# Patient Record
Sex: Male | Born: 1950 | Race: White | Hispanic: No | Marital: Married | Smoking: Former smoker
Health system: Southern US, Community
[De-identification: ages and names within clinical notes are randomized; demographics above are authoritative.]

## PROBLEM LIST (undated history)

## (undated) DIAGNOSIS — F419 Anxiety disorder, unspecified: Secondary | ICD-10-CM

## (undated) DIAGNOSIS — R112 Nausea with vomiting, unspecified: Secondary | ICD-10-CM

## (undated) DIAGNOSIS — Z8489 Family history of other specified conditions: Secondary | ICD-10-CM

## (undated) DIAGNOSIS — Z9889 Other specified postprocedural states: Secondary | ICD-10-CM

## (undated) DIAGNOSIS — J189 Pneumonia, unspecified organism: Secondary | ICD-10-CM

## (undated) DIAGNOSIS — K5790 Diverticulosis of intestine, part unspecified, without perforation or abscess without bleeding: Secondary | ICD-10-CM

## (undated) DIAGNOSIS — T7840XA Allergy, unspecified, initial encounter: Secondary | ICD-10-CM

## (undated) DIAGNOSIS — C801 Malignant (primary) neoplasm, unspecified: Secondary | ICD-10-CM

## (undated) DIAGNOSIS — K648 Other hemorrhoids: Secondary | ICD-10-CM

## (undated) DIAGNOSIS — M1990 Unspecified osteoarthritis, unspecified site: Secondary | ICD-10-CM

## (undated) HISTORY — DX: Nausea with vomiting, unspecified: R11.2

## (undated) HISTORY — DX: Allergy, unspecified, initial encounter: T78.40XA

## (undated) HISTORY — DX: Malignant (primary) neoplasm, unspecified: C80.1

## (undated) HISTORY — DX: Diverticulosis of intestine, part unspecified, without perforation or abscess without bleeding: K57.90

## (undated) HISTORY — DX: Unspecified osteoarthritis, unspecified site: M19.90

## (undated) HISTORY — PX: APPENDECTOMY: SHX54

## (undated) HISTORY — PX: LASIK: SHX215

## (undated) HISTORY — PX: SKIN CANCER EXCISION: SHX779

## (undated) HISTORY — PX: CARPAL TUNNEL RELEASE: SHX101

## (undated) HISTORY — DX: Other hemorrhoids: K64.8

## (undated) HISTORY — DX: Other specified postprocedural states: Z98.890

## (undated) HISTORY — PX: COLONOSCOPY: SHX174

## (undated) HISTORY — PX: ROTATOR CUFF REPAIR: SHX139

## (undated) HISTORY — PX: INGUINAL HERNIA REPAIR: SUR1180

## (undated) HISTORY — PX: OTHER SURGICAL HISTORY: SHX169

---

## 1998-04-06 ENCOUNTER — Ambulatory Visit (HOSPITAL_BASED_OUTPATIENT_CLINIC_OR_DEPARTMENT_OTHER): Admission: RE | Admit: 1998-04-06 | Discharge: 1998-04-06 | Payer: Self-pay | Admitting: Orthopedic Surgery

## 2001-02-05 ENCOUNTER — Ambulatory Visit (HOSPITAL_COMMUNITY): Admission: RE | Admit: 2001-02-05 | Discharge: 2001-02-05 | Payer: Self-pay | Admitting: Gastroenterology

## 2005-10-02 ENCOUNTER — Ambulatory Visit (HOSPITAL_BASED_OUTPATIENT_CLINIC_OR_DEPARTMENT_OTHER): Admission: RE | Admit: 2005-10-02 | Discharge: 2005-10-02 | Payer: Self-pay | Admitting: Orthopaedic Surgery

## 2016-01-24 DIAGNOSIS — Z13220 Encounter for screening for lipoid disorders: Secondary | ICD-10-CM | POA: Diagnosis not present

## 2016-01-24 DIAGNOSIS — Z1389 Encounter for screening for other disorder: Secondary | ICD-10-CM | POA: Diagnosis not present

## 2016-01-24 DIAGNOSIS — Z1329 Encounter for screening for other suspected endocrine disorder: Secondary | ICD-10-CM | POA: Diagnosis not present

## 2016-01-24 DIAGNOSIS — Z0000 Encounter for general adult medical examination without abnormal findings: Secondary | ICD-10-CM | POA: Diagnosis not present

## 2016-01-24 DIAGNOSIS — Z125 Encounter for screening for malignant neoplasm of prostate: Secondary | ICD-10-CM | POA: Diagnosis not present

## 2016-01-24 DIAGNOSIS — Z1211 Encounter for screening for malignant neoplasm of colon: Secondary | ICD-10-CM | POA: Diagnosis not present

## 2016-01-30 DIAGNOSIS — C44622 Squamous cell carcinoma of skin of right upper limb, including shoulder: Secondary | ICD-10-CM | POA: Diagnosis not present

## 2016-03-16 DIAGNOSIS — H2513 Age-related nuclear cataract, bilateral: Secondary | ICD-10-CM | POA: Diagnosis not present

## 2016-03-30 DIAGNOSIS — H6123 Impacted cerumen, bilateral: Secondary | ICD-10-CM | POA: Diagnosis not present

## 2016-03-30 DIAGNOSIS — J188 Other pneumonia, unspecified organism: Secondary | ICD-10-CM | POA: Diagnosis not present

## 2016-08-24 DIAGNOSIS — J0100 Acute maxillary sinusitis, unspecified: Secondary | ICD-10-CM | POA: Diagnosis not present

## 2016-08-24 DIAGNOSIS — J209 Acute bronchitis, unspecified: Secondary | ICD-10-CM | POA: Diagnosis not present

## 2016-10-02 DIAGNOSIS — L570 Actinic keratosis: Secondary | ICD-10-CM | POA: Diagnosis not present

## 2016-10-02 DIAGNOSIS — R233 Spontaneous ecchymoses: Secondary | ICD-10-CM | POA: Diagnosis not present

## 2016-10-02 DIAGNOSIS — D485 Neoplasm of uncertain behavior of skin: Secondary | ICD-10-CM | POA: Diagnosis not present

## 2017-01-31 DIAGNOSIS — Z1211 Encounter for screening for malignant neoplasm of colon: Secondary | ICD-10-CM | POA: Diagnosis not present

## 2017-01-31 DIAGNOSIS — Z0000 Encounter for general adult medical examination without abnormal findings: Secondary | ICD-10-CM | POA: Diagnosis not present

## 2017-01-31 DIAGNOSIS — F3160 Bipolar disorder, current episode mixed, unspecified: Secondary | ICD-10-CM | POA: Diagnosis not present

## 2017-01-31 DIAGNOSIS — M79609 Pain in unspecified limb: Secondary | ICD-10-CM | POA: Diagnosis not present

## 2017-01-31 DIAGNOSIS — Z1379 Encounter for other screening for genetic and chromosomal anomalies: Secondary | ICD-10-CM | POA: Diagnosis not present

## 2017-01-31 DIAGNOSIS — Z13220 Encounter for screening for lipoid disorders: Secondary | ICD-10-CM | POA: Diagnosis not present

## 2017-01-31 DIAGNOSIS — Z1329 Encounter for screening for other suspected endocrine disorder: Secondary | ICD-10-CM | POA: Diagnosis not present

## 2017-01-31 DIAGNOSIS — I10 Essential (primary) hypertension: Secondary | ICD-10-CM | POA: Diagnosis not present

## 2017-01-31 DIAGNOSIS — Z125 Encounter for screening for malignant neoplasm of prostate: Secondary | ICD-10-CM | POA: Diagnosis not present

## 2017-04-11 DIAGNOSIS — L570 Actinic keratosis: Secondary | ICD-10-CM | POA: Diagnosis not present

## 2017-07-05 DIAGNOSIS — H5213 Myopia, bilateral: Secondary | ICD-10-CM | POA: Diagnosis not present

## 2017-10-03 DIAGNOSIS — L300 Nummular dermatitis: Secondary | ICD-10-CM | POA: Diagnosis not present

## 2017-10-03 DIAGNOSIS — L821 Other seborrheic keratosis: Secondary | ICD-10-CM | POA: Diagnosis not present

## 2017-10-03 DIAGNOSIS — D1801 Hemangioma of skin and subcutaneous tissue: Secondary | ICD-10-CM | POA: Diagnosis not present

## 2017-10-03 DIAGNOSIS — C44619 Basal cell carcinoma of skin of left upper limb, including shoulder: Secondary | ICD-10-CM | POA: Diagnosis not present

## 2018-01-16 DIAGNOSIS — J069 Acute upper respiratory infection, unspecified: Secondary | ICD-10-CM | POA: Diagnosis not present

## 2018-02-03 DIAGNOSIS — Z0000 Encounter for general adult medical examination without abnormal findings: Secondary | ICD-10-CM | POA: Diagnosis not present

## 2018-02-03 DIAGNOSIS — Z139 Encounter for screening, unspecified: Secondary | ICD-10-CM | POA: Diagnosis not present

## 2018-02-03 DIAGNOSIS — Z1331 Encounter for screening for depression: Secondary | ICD-10-CM | POA: Diagnosis not present

## 2018-02-03 DIAGNOSIS — Z131 Encounter for screening for diabetes mellitus: Secondary | ICD-10-CM | POA: Diagnosis not present

## 2018-02-03 DIAGNOSIS — Z13220 Encounter for screening for lipoid disorders: Secondary | ICD-10-CM | POA: Diagnosis not present

## 2018-02-03 DIAGNOSIS — Z1211 Encounter for screening for malignant neoplasm of colon: Secondary | ICD-10-CM | POA: Diagnosis not present

## 2018-02-03 DIAGNOSIS — Z125 Encounter for screening for malignant neoplasm of prostate: Secondary | ICD-10-CM | POA: Diagnosis not present

## 2018-04-12 DIAGNOSIS — S61203A Unspecified open wound of left middle finger without damage to nail, initial encounter: Secondary | ICD-10-CM | POA: Diagnosis not present

## 2018-09-29 DIAGNOSIS — D485 Neoplasm of uncertain behavior of skin: Secondary | ICD-10-CM | POA: Diagnosis not present

## 2018-09-29 DIAGNOSIS — C44519 Basal cell carcinoma of skin of other part of trunk: Secondary | ICD-10-CM | POA: Diagnosis not present

## 2018-10-08 DIAGNOSIS — H2513 Age-related nuclear cataract, bilateral: Secondary | ICD-10-CM | POA: Diagnosis not present

## 2018-11-12 ENCOUNTER — Encounter: Payer: Self-pay | Admitting: Gastroenterology

## 2018-12-02 ENCOUNTER — Other Ambulatory Visit: Payer: Self-pay

## 2018-12-02 ENCOUNTER — Ambulatory Visit (AMBULATORY_SURGERY_CENTER): Payer: Self-pay | Admitting: *Deleted

## 2018-12-02 VITALS — Ht 68.0 in | Wt 150.0 lb

## 2018-12-02 DIAGNOSIS — Z1211 Encounter for screening for malignant neoplasm of colon: Secondary | ICD-10-CM

## 2018-12-02 MED ORDER — PEG 3350-KCL-NA BICARB-NACL 420 G PO SOLR: 4000.0000 mL | Freq: Once | ORAL | 0 refills | Status: AC

## 2018-12-02 NOTE — Progress Notes (Signed)
No egg or soy allergy known to patient  issues with past sedation with any surgeries  or procedures of PONV, no intubation problems  No diet pills per patient No home 02 use per patient  No blood thinners per patient  Pt denies issues with constipation  No A fib or A flutter  EMMI video sent to pt's e mail   Pt is aware that care partner will wait in the car during procedure; if they feel like they will be too hot to wait in the car; they may wait in the lobby.  We want them to wear a mask (we do not have any that we can provide them), practice social distancing, and we will check their temperatures when they get here.  I did remind patient that their care partner needs to stay in the parking lot the entire time. Pt will wear mask into building.  Pt verified name, DOB, address and insurance during PV today. Pt mailed instruction packet to included paper to complete and mail back to [facility] with addressed and stamped envelope, Emmi video, copy of consent form to read and not return, and instruction. PV completed over the phone. Pt encouraged to call with questions or issues

## 2018-12-15 ENCOUNTER — Telehealth: Payer: Self-pay | Admitting: Gastroenterology

## 2018-12-15 NOTE — Telephone Encounter (Signed)

## 2018-12-15 NOTE — Telephone Encounter (Signed)
Pt responded "no" to all screening questions °

## 2018-12-16 ENCOUNTER — Other Ambulatory Visit: Payer: Self-pay

## 2018-12-16 ENCOUNTER — Ambulatory Visit (AMBULATORY_SURGERY_CENTER): Payer: PPO | Admitting: Gastroenterology

## 2018-12-16 ENCOUNTER — Encounter: Payer: Self-pay | Admitting: Gastroenterology

## 2018-12-16 VITALS — BP 119/86 | HR 80 | Temp 98.7°F | Resp 15 | Ht 68.0 in | Wt 150.0 lb

## 2018-12-16 DIAGNOSIS — D122 Benign neoplasm of ascending colon: Secondary | ICD-10-CM | POA: Diagnosis not present

## 2018-12-16 DIAGNOSIS — Z1211 Encounter for screening for malignant neoplasm of colon: Secondary | ICD-10-CM

## 2018-12-16 DIAGNOSIS — D120 Benign neoplasm of cecum: Secondary | ICD-10-CM

## 2018-12-16 DIAGNOSIS — Z86010 Personal history of colonic polyps: Secondary | ICD-10-CM | POA: Diagnosis not present

## 2018-12-16 MED ORDER — SODIUM CHLORIDE 0.9 % IV SOLN: 500.0000 mL | Freq: Once | INTRAVENOUS | Status: DC

## 2018-12-16 NOTE — Progress Notes (Signed)
Report given to PACU, vss 

## 2018-12-16 NOTE — Patient Instructions (Signed)
Please read handouts provided. Await pathology results. High fiber diet. Continue present medications. Return to GI clinic as needed.       YOU HAD AN ENDOSCOPIC PROCEDURE TODAY AT [facility]:   Refer to the procedure report that was given to you for any specific questions about what was found during the examination.  If the procedure report does not answer your questions, please call your gastroenterologist to clarify.  If you requested that your care partner not be given the details of your procedure findings, then the procedure report has been included in a sealed envelope for you to review at your convenience later.  YOU SHOULD EXPECT: Some feelings of bloating in the abdomen. Passage of more gas than usual.  Walking can help get rid of the air that was put into your GI tract during the procedure and reduce the bloating. If you had a lower endoscopy (such as a colonoscopy or flexible sigmoidoscopy) you may notice spotting of blood in your stool or on the toilet paper. If you underwent a bowel prep for your procedure, you may not have a normal bowel movement for a few days.  Please Note:  You might notice some irritation and congestion in your nose or some drainage.  This is from the oxygen used during your procedure.  There is no need for concern and it should clear up in a day or so.  SYMPTOMS TO REPORT IMMEDIATELY:   Following lower endoscopy (colonoscopy or flexible sigmoidoscopy):  Excessive amounts of blood in the stool  Significant tenderness or worsening of abdominal pains  Swelling of the abdomen that is new, acute  Fever of 100F or higher   For urgent or emergent issues, a gastroenterologist can be reached at any hour by calling [phone].   DIET:  We do recommend a small meal at first, but then you may proceed to your regular diet.  Drink plenty of fluids but you should avoid alcoholic beverages for 24 hours.  ACTIVITY:  You should plan to take it  easy for the rest of today and you should NOT DRIVE or use heavy machinery until tomorrow (because of the sedation medicines used during the test).    FOLLOW UP: Our staff will call the number listed on your records 48-72 hours following your procedure to check on you and address any questions or concerns that you may have regarding the information given to you following your procedure. If we do not reach you, we will leave a message.  We will attempt to reach you two times.  During this call, we will ask if you have developed any symptoms of COVID 19. If you develop any symptoms (ie: fever, flu-like symptoms, shortness of breath, cough etc.) before then, please call [phone].  If you test positive for Covid 19 in the 2 weeks post procedure, please call and report this information to [place].    If any biopsies were taken you will be contacted by phone or by letter within the next 1-3 weeks.  Please call us at [phone] if you have not heard about the biopsies in 3 weeks.    SIGNATURES/CONFIDENTIALITY: You and/or your care partner have signed paperwork which will be entered into your electronic medical record.  These signatures attest to the fact that that the information above on your After Visit Summary has been reviewed and is understood.  Full responsibility of the confidentiality of this discharge information lies with you and/or your care-partner.

## 2018-12-16 NOTE — Progress Notes (Signed)
Pt's states no medical or surgical changes since previsit or office visit. [facility] took temp and Riki Sheer took vitals.

## 2018-12-16 NOTE — Op Note (Signed)
[place] Patient Name: Patrick Curtis Procedure Date: 12/16/2018 8:34 AM MRN: [id number] Endoscopist: Jackquline Denmark , MD Age: 68 Referring MD:  Date of Birth: 03-17-1951 Gender: Male Account #: [id number] Procedure:                Colonoscopy Indications:              Screening for colorectal malignant neoplasm Medicines:                Monitored Anesthesia Care Procedure:                Pre-Anesthesia Assessment:                           - Prior to the procedure, a History and Physical                            was performed, and patient medications and                            allergies were reviewed. The patient's tolerance of                            previous anesthesia was also reviewed. The risks                            and benefits of the procedure and the sedation                            options and risks were discussed with the patient.                            All questions were answered, and informed consent                            was obtained. Prior Anticoagulants: The patient has                            taken no previous anticoagulant or antiplatelet                            agents. ASA Grade Assessment: I - A normal, healthy                            patient. After reviewing the risks and benefits,                            the patient was deemed in satisfactory condition to                            undergo the procedure.                           After obtaining informed consent, the colonoscope  was passed under direct vision. Throughout the                            procedure, the patient's blood pressure, pulse, and                            oxygen saturations were monitored continuously. The                            Colonoscope was introduced through the anus and                            advanced to the the cecum, identified by                            appendiceal orifice and ileocecal valve. The                            colonoscopy was performed without difficulty. The                            patient tolerated the procedure well. The quality                            of the bowel preparation was good. The ileocecal                            valve, appendiceal orifice, and rectum were                            photographed. Scope In: 8:40:56 AM Scope Out: 8:54:27 AM Scope Withdrawal Time: 0 hours 11 minutes 5 seconds  Total Procedure Duration: 0 hours 13 minutes 31 seconds  Findings:                 Three sessile polyps were found in the mid                            ascending colon and cecum. The polyps were 3 to 4                            mm in size. These polyps were removed with a cold                            snare. Resection and retrieval were complete.                            Estimated blood loss: none.                           Multiple small-mouthed diverticula were found in                            the sigmoid colon, descending colon and rare in  ascending colon. Few diverticula in the sigmoid                            colon with stool impaction.                           Non-bleeding internal hemorrhoids were found during                            retroflexion. The hemorrhoids were small.                           The exam was otherwise without abnormality on                            direct and retroflexion views. Complications:            No immediate complications. Estimated Blood Loss:     Estimated blood loss: none. Impression:               - Three 3 to 4 mm polyps in the mid ascending colon                            and in the cecum, removed with a cold snare.                            Resected and retrieved.                           - Predominantly moderate sigmoid diverticulosis.                           - Otherwise normal colonoscopy. Recommendation:           - Patient has a contact number available for                             emergencies. The signs and symptoms of potential                            delayed complications were discussed with the                            patient. Return to normal activities tomorrow.                            Written discharge instructions were provided to the                            patient.                           - High fiber diet.                           - Continue present medications.                           -  Await pathology results.                           - Repeat colonoscopy for surveillance based on                            pathology results.                           - Return to GI office PRN.                           - Discussed with Debi (patient's wife over the                            phone). Jackquline Denmark, MD 12/16/2018 9:02:45 AM This report has been signed electronically.

## 2018-12-16 NOTE — Progress Notes (Signed)
Called to room to assist during endoscopic procedure.  Patient ID and intended procedure confirmed with present staff. Received instructions for my participation in the procedure from the performing physician.  

## 2018-12-18 ENCOUNTER — Telehealth: Payer: Self-pay

## 2018-12-18 NOTE — Telephone Encounter (Signed)
Covid-19 screening questions   Do you now or have you had a fever in the last 14 days? No.  Do you have any respiratory symptoms of shortness of breath or cough now or in the last 14 days? No.  Do you have any family members or close contacts with diagnosed or suspected Covid-19 in the past 14 days? No.  Have you been tested for Covid-19 and found to be positive? No.       Follow up Call-  Call back number 12/16/2018  Post procedure Call Back phone  # [phone]  Permission to leave phone message Yes  Some recent data might be hidden     Patient questions:  Do you have a fever, pain , or abdominal swelling? No. Pain Score  0 *  Have you tolerated food without any problems? Yes.    Have you been able to return to your normal activities? Yes.    Do you have any questions about your discharge instructions: Diet   No. Medications  No. Follow up visit  No.  Do you have questions or concerns about your Care? No.  Actions: * If pain score is 4 or above: No action needed, pain <4.

## 2018-12-19 ENCOUNTER — Encounter: Payer: Self-pay | Admitting: Gastroenterology

## 2019-01-13 DIAGNOSIS — M25832 Other specified joint disorders, left wrist: Secondary | ICD-10-CM | POA: Diagnosis not present

## 2019-01-13 DIAGNOSIS — M79642 Pain in left hand: Secondary | ICD-10-CM | POA: Diagnosis not present

## 2019-01-13 DIAGNOSIS — M180 Bilateral primary osteoarthritis of first carpometacarpal joints: Secondary | ICD-10-CM | POA: Diagnosis not present

## 2019-01-13 DIAGNOSIS — M25831 Other specified joint disorders, right wrist: Secondary | ICD-10-CM | POA: Diagnosis not present

## 2019-01-13 DIAGNOSIS — M19042 Primary osteoarthritis, left hand: Secondary | ICD-10-CM | POA: Diagnosis not present

## 2019-01-13 DIAGNOSIS — M720 Palmar fascial fibromatosis [Dupuytren]: Secondary | ICD-10-CM | POA: Diagnosis not present

## 2019-01-13 DIAGNOSIS — M19041 Primary osteoarthritis, right hand: Secondary | ICD-10-CM | POA: Diagnosis not present

## 2019-01-13 DIAGNOSIS — M79641 Pain in right hand: Secondary | ICD-10-CM | POA: Diagnosis not present

## 2019-02-08 DIAGNOSIS — M545 Low back pain: Secondary | ICD-10-CM | POA: Diagnosis not present

## 2019-02-08 DIAGNOSIS — M47819 Spondylosis without myelopathy or radiculopathy, site unspecified: Secondary | ICD-10-CM | POA: Diagnosis not present

## 2019-02-08 DIAGNOSIS — M25561 Pain in right knee: Secondary | ICD-10-CM | POA: Diagnosis not present

## 2019-02-11 DIAGNOSIS — M25561 Pain in right knee: Secondary | ICD-10-CM | POA: Diagnosis not present

## 2019-02-11 DIAGNOSIS — M25551 Pain in right hip: Secondary | ICD-10-CM | POA: Diagnosis not present

## 2019-02-25 DIAGNOSIS — G5601 Carpal tunnel syndrome, right upper limb: Secondary | ICD-10-CM | POA: Diagnosis not present

## 2019-02-26 DIAGNOSIS — Z1339 Encounter for screening examination for other mental health and behavioral disorders: Secondary | ICD-10-CM | POA: Diagnosis not present

## 2019-02-26 DIAGNOSIS — Z23 Encounter for immunization: Secondary | ICD-10-CM | POA: Diagnosis not present

## 2019-02-26 DIAGNOSIS — Z131 Encounter for screening for diabetes mellitus: Secondary | ICD-10-CM | POA: Diagnosis not present

## 2019-02-26 DIAGNOSIS — Z6824 Body mass index (BMI) 24.0-24.9, adult: Secondary | ICD-10-CM | POA: Diagnosis not present

## 2019-02-26 DIAGNOSIS — Z139 Encounter for screening, unspecified: Secondary | ICD-10-CM | POA: Diagnosis not present

## 2019-02-26 DIAGNOSIS — Z1329 Encounter for screening for other suspected endocrine disorder: Secondary | ICD-10-CM | POA: Diagnosis not present

## 2019-02-26 DIAGNOSIS — Z13220 Encounter for screening for lipoid disorders: Secondary | ICD-10-CM | POA: Diagnosis not present

## 2019-02-26 DIAGNOSIS — Z1331 Encounter for screening for depression: Secondary | ICD-10-CM | POA: Diagnosis not present

## 2019-02-26 DIAGNOSIS — Z136 Encounter for screening for cardiovascular disorders: Secondary | ICD-10-CM | POA: Diagnosis not present

## 2019-02-26 DIAGNOSIS — Z7189 Other specified counseling: Secondary | ICD-10-CM | POA: Diagnosis not present

## 2019-02-26 DIAGNOSIS — Z0000 Encounter for general adult medical examination without abnormal findings: Secondary | ICD-10-CM | POA: Diagnosis not present

## 2019-03-03 DIAGNOSIS — M79642 Pain in left hand: Secondary | ICD-10-CM | POA: Diagnosis not present

## 2019-03-03 DIAGNOSIS — M720 Palmar fascial fibromatosis [Dupuytren]: Secondary | ICD-10-CM | POA: Diagnosis not present

## 2019-03-03 DIAGNOSIS — G5601 Carpal tunnel syndrome, right upper limb: Secondary | ICD-10-CM | POA: Diagnosis not present

## 2019-03-03 DIAGNOSIS — M79641 Pain in right hand: Secondary | ICD-10-CM | POA: Diagnosis not present

## 2019-03-03 DIAGNOSIS — M180 Bilateral primary osteoarthritis of first carpometacarpal joints: Secondary | ICD-10-CM | POA: Diagnosis not present

## 2019-03-05 DIAGNOSIS — Z1329 Encounter for screening for other suspected endocrine disorder: Secondary | ICD-10-CM | POA: Diagnosis not present

## 2019-03-05 DIAGNOSIS — Z125 Encounter for screening for malignant neoplasm of prostate: Secondary | ICD-10-CM | POA: Diagnosis not present

## 2019-03-05 DIAGNOSIS — R7303 Prediabetes: Secondary | ICD-10-CM | POA: Diagnosis not present

## 2019-03-05 DIAGNOSIS — Z6824 Body mass index (BMI) 24.0-24.9, adult: Secondary | ICD-10-CM | POA: Diagnosis not present

## 2019-03-05 DIAGNOSIS — R7989 Other specified abnormal findings of blood chemistry: Secondary | ICD-10-CM | POA: Diagnosis not present

## 2019-03-20 ENCOUNTER — Other Ambulatory Visit: Payer: Self-pay

## 2019-03-20 DIAGNOSIS — G5601 Carpal tunnel syndrome, right upper limb: Secondary | ICD-10-CM | POA: Diagnosis not present

## 2019-03-20 DIAGNOSIS — M720 Palmar fascial fibromatosis [Dupuytren]: Secondary | ICD-10-CM | POA: Diagnosis not present

## 2019-03-24 DIAGNOSIS — M720 Palmar fascial fibromatosis [Dupuytren]: Secondary | ICD-10-CM | POA: Diagnosis not present

## 2019-03-24 DIAGNOSIS — R29898 Other symptoms and signs involving the musculoskeletal system: Secondary | ICD-10-CM | POA: Diagnosis not present

## 2019-03-24 DIAGNOSIS — M25641 Stiffness of right hand, not elsewhere classified: Secondary | ICD-10-CM | POA: Diagnosis not present

## 2019-03-24 DIAGNOSIS — G5601 Carpal tunnel syndrome, right upper limb: Secondary | ICD-10-CM | POA: Diagnosis not present

## 2019-03-24 DIAGNOSIS — M79644 Pain in right finger(s): Secondary | ICD-10-CM | POA: Diagnosis not present

## 2019-05-19 DIAGNOSIS — G5601 Carpal tunnel syndrome, right upper limb: Secondary | ICD-10-CM | POA: Diagnosis not present

## 2019-05-19 DIAGNOSIS — M720 Palmar fascial fibromatosis [Dupuytren]: Secondary | ICD-10-CM | POA: Diagnosis not present

## 2019-05-19 DIAGNOSIS — M6281 Muscle weakness (generalized): Secondary | ICD-10-CM | POA: Diagnosis not present

## 2019-05-19 DIAGNOSIS — M79641 Pain in right hand: Secondary | ICD-10-CM | POA: Diagnosis not present

## 2019-05-26 DIAGNOSIS — G5601 Carpal tunnel syndrome, right upper limb: Secondary | ICD-10-CM | POA: Diagnosis not present

## 2019-05-26 DIAGNOSIS — M6281 Muscle weakness (generalized): Secondary | ICD-10-CM | POA: Diagnosis not present

## 2019-05-26 DIAGNOSIS — M720 Palmar fascial fibromatosis [Dupuytren]: Secondary | ICD-10-CM | POA: Diagnosis not present

## 2019-05-26 DIAGNOSIS — M79641 Pain in right hand: Secondary | ICD-10-CM | POA: Diagnosis not present

## 2019-06-02 DIAGNOSIS — M79641 Pain in right hand: Secondary | ICD-10-CM | POA: Diagnosis not present

## 2019-06-02 DIAGNOSIS — M720 Palmar fascial fibromatosis [Dupuytren]: Secondary | ICD-10-CM | POA: Diagnosis not present

## 2019-06-02 DIAGNOSIS — G5601 Carpal tunnel syndrome, right upper limb: Secondary | ICD-10-CM | POA: Diagnosis not present

## 2019-06-02 DIAGNOSIS — M6281 Muscle weakness (generalized): Secondary | ICD-10-CM | POA: Diagnosis not present

## 2019-06-09 DIAGNOSIS — M79641 Pain in right hand: Secondary | ICD-10-CM | POA: Diagnosis not present

## 2019-06-09 DIAGNOSIS — M720 Palmar fascial fibromatosis [Dupuytren]: Secondary | ICD-10-CM | POA: Diagnosis not present

## 2019-06-09 DIAGNOSIS — M6281 Muscle weakness (generalized): Secondary | ICD-10-CM | POA: Diagnosis not present

## 2019-06-09 DIAGNOSIS — G5601 Carpal tunnel syndrome, right upper limb: Secondary | ICD-10-CM | POA: Diagnosis not present

## 2019-06-18 DIAGNOSIS — M720 Palmar fascial fibromatosis [Dupuytren]: Secondary | ICD-10-CM | POA: Diagnosis not present

## 2019-06-18 DIAGNOSIS — M6281 Muscle weakness (generalized): Secondary | ICD-10-CM | POA: Diagnosis not present

## 2019-06-18 DIAGNOSIS — G5601 Carpal tunnel syndrome, right upper limb: Secondary | ICD-10-CM | POA: Diagnosis not present

## 2019-06-18 DIAGNOSIS — M79641 Pain in right hand: Secondary | ICD-10-CM | POA: Diagnosis not present

## 2019-06-23 DIAGNOSIS — G5601 Carpal tunnel syndrome, right upper limb: Secondary | ICD-10-CM | POA: Diagnosis not present

## 2019-06-23 DIAGNOSIS — M6281 Muscle weakness (generalized): Secondary | ICD-10-CM | POA: Diagnosis not present

## 2019-06-23 DIAGNOSIS — M720 Palmar fascial fibromatosis [Dupuytren]: Secondary | ICD-10-CM | POA: Diagnosis not present

## 2019-06-23 DIAGNOSIS — M79641 Pain in right hand: Secondary | ICD-10-CM | POA: Diagnosis not present

## 2019-06-30 DIAGNOSIS — M720 Palmar fascial fibromatosis [Dupuytren]: Secondary | ICD-10-CM | POA: Diagnosis not present

## 2019-06-30 DIAGNOSIS — M6281 Muscle weakness (generalized): Secondary | ICD-10-CM | POA: Diagnosis not present

## 2019-06-30 DIAGNOSIS — M79641 Pain in right hand: Secondary | ICD-10-CM | POA: Diagnosis not present

## 2019-06-30 DIAGNOSIS — G5601 Carpal tunnel syndrome, right upper limb: Secondary | ICD-10-CM | POA: Diagnosis not present

## 2019-07-07 DIAGNOSIS — M6281 Muscle weakness (generalized): Secondary | ICD-10-CM | POA: Diagnosis not present

## 2019-07-07 DIAGNOSIS — M79641 Pain in right hand: Secondary | ICD-10-CM | POA: Diagnosis not present

## 2019-07-07 DIAGNOSIS — G5601 Carpal tunnel syndrome, right upper limb: Secondary | ICD-10-CM | POA: Diagnosis not present

## 2019-07-07 DIAGNOSIS — M720 Palmar fascial fibromatosis [Dupuytren]: Secondary | ICD-10-CM | POA: Diagnosis not present

## 2019-07-09 DIAGNOSIS — Z4789 Encounter for other orthopedic aftercare: Secondary | ICD-10-CM | POA: Diagnosis not present

## 2019-07-09 DIAGNOSIS — M180 Bilateral primary osteoarthritis of first carpometacarpal joints: Secondary | ICD-10-CM | POA: Diagnosis not present

## 2019-07-09 DIAGNOSIS — M720 Palmar fascial fibromatosis [Dupuytren]: Secondary | ICD-10-CM | POA: Diagnosis not present

## 2019-07-09 DIAGNOSIS — G5601 Carpal tunnel syndrome, right upper limb: Secondary | ICD-10-CM | POA: Diagnosis not present

## 2019-07-14 DIAGNOSIS — M720 Palmar fascial fibromatosis [Dupuytren]: Secondary | ICD-10-CM | POA: Diagnosis not present

## 2019-07-14 DIAGNOSIS — G5601 Carpal tunnel syndrome, right upper limb: Secondary | ICD-10-CM | POA: Diagnosis not present

## 2019-07-14 DIAGNOSIS — M6281 Muscle weakness (generalized): Secondary | ICD-10-CM | POA: Diagnosis not present

## 2019-07-14 DIAGNOSIS — M79641 Pain in right hand: Secondary | ICD-10-CM | POA: Diagnosis not present

## 2019-07-21 DIAGNOSIS — E039 Hypothyroidism, unspecified: Secondary | ICD-10-CM | POA: Diagnosis not present

## 2019-07-28 DIAGNOSIS — E039 Hypothyroidism, unspecified: Secondary | ICD-10-CM | POA: Diagnosis not present

## 2019-07-29 ENCOUNTER — Other Ambulatory Visit: Payer: Self-pay | Admitting: Orthopaedic Surgery

## 2019-07-29 DIAGNOSIS — M1611 Unilateral primary osteoarthritis, right hip: Secondary | ICD-10-CM | POA: Diagnosis not present

## 2019-08-06 DIAGNOSIS — E039 Hypothyroidism, unspecified: Secondary | ICD-10-CM | POA: Diagnosis not present

## 2019-08-06 DIAGNOSIS — Z01818 Encounter for other preprocedural examination: Secondary | ICD-10-CM | POA: Diagnosis not present

## 2019-08-06 DIAGNOSIS — M1611 Unilateral primary osteoarthritis, right hip: Secondary | ICD-10-CM | POA: Diagnosis not present

## 2019-08-06 DIAGNOSIS — R7303 Prediabetes: Secondary | ICD-10-CM | POA: Diagnosis not present

## 2019-08-11 DIAGNOSIS — R7303 Prediabetes: Secondary | ICD-10-CM | POA: Diagnosis not present

## 2019-08-21 ENCOUNTER — Encounter (HOSPITAL_COMMUNITY): Payer: Self-pay

## 2019-08-21 ENCOUNTER — Encounter (HOSPITAL_COMMUNITY)
Admission: RE | Admit: 2019-08-21 | Discharge: 2019-08-21 | Disposition: A | Discharge status: Home / Self Care | Payer: PPO | Source: Ambulatory Visit | Attending: Orthopaedic Surgery | Admitting: Orthopaedic Surgery

## 2019-08-21 ENCOUNTER — Ambulatory Visit (HOSPITAL_COMMUNITY)
Admission: RE | Admit: 2019-08-21 | Discharge: 2019-08-21 | Disposition: A | Discharge status: Home / Self Care | Payer: PPO | Source: Ambulatory Visit | Attending: Orthopaedic Surgery | Admitting: Orthopaedic Surgery

## 2019-08-21 ENCOUNTER — Other Ambulatory Visit: Payer: Self-pay

## 2019-08-21 ENCOUNTER — Encounter (INDEPENDENT_AMBULATORY_CARE_PROVIDER_SITE_OTHER): Payer: Self-pay

## 2019-08-21 DIAGNOSIS — Z01818 Encounter for other preprocedural examination: Secondary | ICD-10-CM

## 2019-08-21 HISTORY — DX: Family history of other specified conditions: Z84.89

## 2019-08-21 HISTORY — DX: Pneumonia, unspecified organism: J18.9

## 2019-08-21 HISTORY — DX: Anxiety disorder, unspecified: F41.9

## 2019-08-21 LAB — CBC WITH DIFFERENTIAL/PLATELET
Abs Immature Granulocytes: 0.03 10*3/uL (ref 0.00–0.07)
Basophils Absolute: 0 10*3/uL (ref 0.0–0.1)
Basophils Relative: 1 %
Eosinophils Absolute: 0.2 10*3/uL (ref 0.0–0.5)
Eosinophils Relative: 2 %
HCT: 48.7 % (ref 39.0–52.0)
Hemoglobin: 15.5 g/dL (ref 13.0–17.0)
Immature Granulocytes: 0 %
Lymphocytes Relative: 25 %
Lymphs Abs: 2 10*3/uL (ref 0.7–4.0)
MCH: 31.6 pg (ref 26.0–34.0)
MCHC: 31.8 g/dL (ref 30.0–36.0)
MCV: 99.4 fL (ref 80.0–100.0)
Monocytes Absolute: 0.8 10*3/uL (ref 0.1–1.0)
Monocytes Relative: 10 %
Neutro Abs: 5 10*3/uL (ref 1.7–7.7)
Neutrophils Relative %: 62 %
Platelets: 223 10*3/uL (ref 150–400)
RBC: 4.9 MIL/uL (ref 4.22–5.81)
RDW: 12.9 % (ref 11.5–15.5)
WBC: 8.1 10*3/uL (ref 4.0–10.5)
nRBC: 0 % (ref 0.0–0.2)

## 2019-08-21 LAB — URINALYSIS, ROUTINE W REFLEX MICROSCOPIC
Bilirubin Urine: NEGATIVE
Glucose, UA: NEGATIVE mg/dL
Hgb urine dipstick: NEGATIVE
Ketones, ur: NEGATIVE mg/dL
Leukocytes,Ua: NEGATIVE
Nitrite: NEGATIVE
Protein, ur: NEGATIVE mg/dL
Specific Gravity, Urine: 1.031 — ABNORMAL HIGH (ref 1.005–1.030)
pH: 5 (ref 5.0–8.0)

## 2019-08-21 LAB — BASIC METABOLIC PANEL
Anion gap: 9 (ref 5–15)
BUN: 27 mg/dL — ABNORMAL HIGH (ref 8–23)
CO2: 29 mmol/L (ref 22–32)
Calcium: 9.3 mg/dL (ref 8.9–10.3)
Chloride: 105 mmol/L (ref 98–111)
Creatinine, Ser: 0.7 mg/dL (ref 0.61–1.24)
GFR calc Af Amer: >60 mL/min (ref >60)
GFR calc non Af Amer: >60 mL/min (ref >60)
Glucose, Bld: 110 mg/dL — ABNORMAL HIGH (ref 70–99)
Potassium: 4.8 mmol/L (ref 3.5–5.1)
Sodium: 143 mmol/L (ref 135–145)

## 2019-08-21 LAB — SURGICAL PCR SCREEN
MRSA, PCR: NEGATIVE
Staphylococcus aureus: NEGATIVE

## 2019-08-21 LAB — PROTIME-INR
INR: 1 (ref 0.8–1.2)
Prothrombin Time: 12.8 seconds (ref 11.4–15.2)

## 2019-08-21 LAB — TYPE AND SCREEN
ABO/RH(D): AB POS
Antibody Screen: NEGATIVE

## 2019-08-21 LAB — APTT: aPTT: 26 seconds (ref 24–36)

## 2019-08-21 LAB — ABO/RH: ABO/RH(D): AB POS

## 2019-08-21 NOTE — Progress Notes (Addendum)
PCP - [facility] [place] clearance on chart  Cardiologist -   Chest x-ray - 08-21-19 on chart  EKG - 08-06-19 on chart Stress Test -  ECHO -  Cardiac Cath -   Sleep Study -  CPAP -   Fasting Blood Sugar -  Checks Blood Sugar _____ times a day  Blood Thinner Instructions: Aspirin Instructions Last Dose:  Anesthesia review:   Patient denies shortness of breath, fever, cough and chest pain at PAT appointment  NONE   Patient verbalized understanding of instructions that were given to them at the PAT appointment. Patient was also instructed that they will need to review over the PAT instructions again at home before surgery.

## 2019-08-21 NOTE — Patient Instructions (Signed)
DUE TO COVID-19 ONLY ONE VISITOR IS ALLOWED TO COME WITH YOU AND STAY IN THE WAITING ROOM ONLY DURING PRE OP AND PROCEDURE DAY OF SURGERY. Two  VISITORs  MAY VISIT WITH YOU AFTER SURGERY IN YOUR PRIVATE ROOM DURING VISITING HOURS ONLY!  10a--8pm  YOU NEED TO HAVE A COVID 19 TEST ON_4-16-21_____ @__0840_am____ , THIS TEST MUST BE DONE BEFORE SURGERY, COME  [address], [place] , [address].  ([place]) ONCE YOUR COVID TEST IS COMPLETED, PLEASE BEGIN THE QUARANTINE INSTRUCTIONS AS OUTLINED IN YOUR HANDOUT.                Patrick Curtis  08/21/2019   Your procedure is scheduled on: 09-01-19   Report to [facility] Main  Entrance   Report to  Short Stay   at          530 AM     Call this number if you have problems the morning of surgery [phone]    Remember: NO SOLID FOOD AFTER MIDNIGHT THE NIGHT PRIOR TO SURGERY. NOTHING BY MOUTH EXCEPT CLEAR LIQUIDS UNTIL  0430am  . PLEASE FINISH ENSURE DRINK PER SURGEON ORDER  WHICH NEEDS TO BE COMPLETED AT   043 am then nothing by mouth .    CLEAR LIQUID DIET   Foods Allowed                                                                                  Foods Excluded  Coffee and tea, regular and decaf  No creamer                                             liquids that you cannot  Plain Jell-O any favor except red or purple                                           see through such as: Fruit ices (not with fruit pulp)                                                                 milk, soups, orange juice  Iced Popsicles                                                        All solid food Carbonated beverages, regular and diet                                    Cranberry,  grape and apple juices Sports drinks like Gatorade Lightly seasoned clear broth or consume(fat free) Sugar, honey syrup   _____________________________________________________________________    . BRUSH YOUR TEETH MORNING OF SURGERY AND  RINSE YOUR MOUTH OUT, NO CHEWING GUM CANDY OR MINTS.     Take these medicines the morning of surgery with A SIP OF WATER: levothyroxine                                 You may not have any metal on your body including hair pins and              piercings  Do not wear jewelry, make-up,  powders or perfumes, deodorant                         Men may shave face and neck.   Do not bring valuables to the hospital. [place].  Contacts, dentures or bridgework may not be worn into surgery. .                Please read over the following fact sheets you were given: _____________________________________________________________________             [facility] - Preparing for Surgery Before surgery, you can play an important role.  Because skin is not sterile, your skin needs to be as free of germs as possible.  You can reduce the number of germs on your skin by washing with CHG (chlorahexidine gluconate) soap before surgery.  CHG is an antiseptic cleaner which kills germs and bonds with the skin to continue killing germs even after washing. Please DO NOT use if you have an allergy to CHG or antibacterial soaps.  If your skin becomes reddened/irritated stop using the CHG and inform your nurse when you arrive at Short Stay. Do not shave (including legs and underarms) for at least 48 hours prior to the first CHG shower.  You may shave your face/neck. Please follow these instructions carefully:  1.  Shower with CHG Soap the night before surgery and the  morning of Surgery.  2.  If you choose to wash your hair, wash your hair first as usual with your  normal  shampoo.  3.  After you shampoo, rinse your hair and body thoroughly to remove the  shampoo.                           4.  Use CHG as you would any other liquid soap.  You can apply chg directly  to the skin and wash                       Gently with a scrungie or clean washcloth.  5.  Apply the  CHG Soap to your body ONLY FROM THE NECK DOWN.   Do not use on face/ open                           Wound or open sores. Avoid contact with eyes, ears mouth and genitals (private parts).                       Wash face,  Development worker, international aid (private  parts) with your normal soap.             6.  Wash thoroughly, paying special attention to the area where your surgery  will be performed.  7.  Thoroughly rinse your body with warm water from the neck down.  8.  DO NOT shower/wash with your normal soap after using and rinsing off  the CHG Soap.                9.  Pat yourself dry with a clean towel.            10.  Wear clean pajamas.            11.  Place clean sheets on your bed the night of your first shower and do not  sleep with pets. Day of Surgery : Do not apply any lotions/deodorants the morning of surgery.  Please wear clean clothes to the hospital/surgery center.  FAILURE TO FOLLOW THESE INSTRUCTIONS MAY RESULT IN THE CANCELLATION OF YOUR SURGERY PATIENT SIGNATURE_________________________________  NURSE SIGNATURE__________________________________  ________________________________________________________________________   Adam Phenix  An incentive spirometer is a tool that can help keep your lungs clear and active. This tool measures how well you are filling your lungs with each breath. Taking long deep breaths may help reverse or decrease the chance of developing breathing (pulmonary) problems (especially infection) following:  A long period of time when you are unable to move or be active. BEFORE THE PROCEDURE   If the spirometer includes an indicator to show your best effort, your nurse or respiratory therapist will set it to a desired goal.  If possible, sit up straight or lean slightly forward. Try not to slouch.  Hold the incentive spirometer in an upright position. INSTRUCTIONS FOR USE  1. Sit on the edge of your bed if possible, or sit up as far as you can in bed or on a  chair. 2. Hold the incentive spirometer in an upright position. 3. Breathe out normally. 4. Place the mouthpiece in your mouth and seal your lips tightly around it. 5. Breathe in slowly and as deeply as possible, raising the piston or the ball toward the top of the column. 6. Hold your breath for 3-5 seconds or for as long as possible. Allow the piston or ball to fall to the bottom of the column. 7. Remove the mouthpiece from your mouth and breathe out normally. 8. Rest for a few seconds and repeat Steps 1 through 7 at least 10 times every 1-2 hours when you are awake. Take your time and take a few normal breaths between deep breaths. 9. The spirometer may include an indicator to show your best effort. Use the indicator as a goal to work toward during each repetition. 10. After each set of 10 deep breaths, practice coughing to be sure your lungs are clear. If you have an incision (the cut made at the time of surgery), support your incision when coughing by placing a pillow or rolled up towels firmly against it. Once you are able to get out of bed, walk around indoors and cough well. You may stop using the incentive spirometer when instructed by your caregiver.  RISKS AND COMPLICATIONS  Take your time so you do not get dizzy or light-headed.  If you are in pain, you may need to take or ask for pain medication before doing incentive spirometry. It is harder to take a deep breath if you are having pain. AFTER USE  Rest and  breathe slowly and easily.  It can be helpful to keep track of a log of your progress. Your caregiver can provide you with a simple table to help with this. If you are using the spirometer at home, follow these instructions: [place] IF:   You are having difficultly using the spirometer.  You have trouble using the spirometer as often as instructed.  Your pain medication is not giving enough relief while using the spirometer.  You develop fever of 100.5 F  (38.1 C) or higher. SEEK IMMEDIATE MEDICAL CARE IF:   You cough up bloody sputum that had not been present before.  You develop fever of 102 F (38.9 C) or greater.  You develop worsening pain at or near the incision site. MAKE SURE YOU:   Understand these instructions.  Will watch your condition.  Will get help right away if you are not doing well or get worse. Document Released: 09/10/2006 Document Revised: 07/23/2011 Document Reviewed: 11/11/2006 Exit[employer] Patient Information 2014 Exit[employer], [place].   ________________________________________________________________________  WHAT IS A BLOOD TRANSFUSION? Blood Transfusion Information  A transfusion is the replacement of blood or some of its parts. Blood is made up of multiple cells which provide different functions.  Red blood cells carry oxygen and are used for blood loss replacement.  White blood cells fight against infection.  Platelets control bleeding.  Plasma helps clot blood.  Other blood products are available for specialized needs, such as hemophilia or other clotting disorders. BEFORE THE TRANSFUSION  Who gives blood for transfusions?   Healthy volunteers who are fully evaluated to make sure their blood is safe. This is blood bank blood. Transfusion therapy is the safest it has ever been in the practice of medicine. Before blood is taken from a donor, a complete history is taken to make sure that person has no history of diseases nor engages in risky social behavior (examples are intravenous drug use or sexual activity with multiple partners). The donor's travel history is screened to minimize risk of transmitting infections, such as malaria. The donated blood is tested for signs of infectious diseases, such as HIV and hepatitis. The blood is then tested to be sure it is compatible with you in order to minimize the chance of a transfusion reaction. If you or a relative donates blood, this is often done in anticipation  of surgery and is not appropriate for emergency situations. It takes many days to process the donated blood. RISKS AND COMPLICATIONS Although transfusion therapy is very safe and saves many lives, the main dangers of transfusion include:   Getting an infectious disease.  Developing a transfusion reaction. This is an allergic reaction to something in the blood you were given. Every precaution is taken to prevent this. The decision to have a blood transfusion has been considered carefully by your caregiver before blood is given. Blood is not given unless the benefits outweigh the risks. AFTER THE TRANSFUSION  Right after receiving a blood transfusion, you will usually feel much better and more energetic. This is especially true if your red blood cells have gotten low (anemic). The transfusion raises the level of the red blood cells which carry oxygen, and this usually causes an energy increase.  The nurse administering the transfusion will monitor you carefully for complications. HOME CARE INSTRUCTIONS  No special instructions are needed after a transfusion. You may find your energy is better. Speak with your caregiver about any limitations on activity for underlying diseases you may have. [place]  CARE IF:   Your condition is not improving after your transfusion.  You develop redness or irritation at the intravenous (IV) site. SEEK IMMEDIATE MEDICAL CARE IF:  Any of the following symptoms occur over the next 12 hours:  Shaking chills.  You have a temperature by mouth above 102 F (38.9 C), not controlled by medicine.  Chest, back, or muscle pain.  People around you feel you are not acting correctly or are confused.  Shortness of breath or difficulty breathing.  Dizziness and fainting.  You get a rash or develop hives.  You have a decrease in urine output.  Your urine turns a dark color or changes to pink, red, or brown. Any of the following symptoms occur over the next 10  days:  You have a temperature by mouth above 102 F (38.9 C), not controlled by medicine.  Shortness of breath.  Weakness after normal activity.  The white part of the eye turns yellow (jaundice).  You have a decrease in the amount of urine or are urinating less often.  Your urine turns a dark color or changes to pink, red, or brown. Document Released: 04/27/2000 Document Revised: 07/23/2011 Document Reviewed: 12/15/2007 [facility] Patient Information 2014 [place], [place].  _______________________________________________________________________

## 2019-08-28 ENCOUNTER — Other Ambulatory Visit (HOSPITAL_COMMUNITY)
Admission: RE | Admit: 2019-08-28 | Discharge: 2019-08-28 | Disposition: A | Discharge status: Home / Self Care | Payer: PPO | Source: Ambulatory Visit | Attending: Orthopaedic Surgery | Admitting: Orthopaedic Surgery

## 2019-08-28 DIAGNOSIS — Z20822 Contact with and (suspected) exposure to covid-19: Secondary | ICD-10-CM | POA: Diagnosis not present

## 2019-08-28 DIAGNOSIS — Z01812 Encounter for preprocedural laboratory examination: Secondary | ICD-10-CM | POA: Diagnosis not present

## 2019-08-28 LAB — SARS CORONAVIRUS 2 (TAT 6-24 HRS): SARS Coronavirus 2: NEGATIVE

## 2019-08-28 NOTE — H&P (Signed)
TOTAL HIP ADMISSION H&P  Patient is admitted for right total hip arthroplasty.  Subjective:  Chief Complaint: right hip pain  HPI: Patrick Curtis, 69 y.o. male, has a history of pain and functional disability in the right hip(s) due to arthritis and patient has failed non-surgical conservative treatments for greater than 12 weeks to include NSAID's and/or analgesics, corticosteriod injections, flexibility and strengthening excercises, use of assistive devices, weight reduction as appropriate and activity modification.  Onset of symptoms was gradual starting 5 years ago with gradually worsening course since that time.The patient noted no past surgery on the right hip(s).  Patient currently rates pain in the right hip at 10 out of 10 with activity. Patient has night pain, worsening of pain with activity and weight bearing, trendelenberg gait, pain that interfers with activities of daily living and crepitus. Patient has evidence of subchondral cysts, subchondral sclerosis, periarticular osteophytes and joint space narrowing by imaging studies. This condition presents safety issues increasing the risk of falls. There is no current active infection.  There are no problems to display for this patient.  Past Medical History:  Diagnosis Date  . Allergy   . Anxiety   . Arthritis   . Cancer ([place])    skin cancer  . Diverticulosis    mild   . Family history of adverse reaction to anesthesia   . Internal hemorrhoids    small   . Pneumonia   . PONV (postoperative nausea and vomiting)     Past Surgical History:  Procedure Laterality Date  . APPENDECTOMY    . CARPAL TUNNEL RELEASE     right  . COLONOSCOPY     x2 total now  . Dupentrines contracture     bil  . INGUINAL HERNIA REPAIR Left   . INGUINAL HERNIA REPAIR Bilateral   . LASIK    . ROTATOR CUFF REPAIR Right   . SKIN CANCER EXCISION     several times    No current facility-administered medications for this encounter.   Current  Outpatient Medications  Medication Sig Dispense Refill Last Dose  . ammonium lactate (AMLACTIN) 12 % cream Apply 1 g topically 3 (three) times daily as needed for dry skin (skin irritation/rash).      . CINNAMON PO Take 1 tablet by mouth daily.      . fluticasone (FLONASE) 50 MCG/ACT nasal spray Place into both nostrils as needed for allergies or rhinitis.     Marland Kitchen levothyroxine (SYNTHROID) 25 MCG tablet Take 25 mcg by mouth daily at 2 am. (0330-0400)     . loratadine (CLARITIN) 10 MG tablet Take 10 mg by mouth daily as needed (allergies).      Marland Kitchen Specialty Vitamins Products (PROSTATE PO) Take 1 tablet by mouth in the morning and at bedtime. Prostate 5LX     . triamcinolone cream (KENALOG) 0.1 % Apply 1 application topically 2 (two) times daily as needed (skin irritation/rash.).      Marland Kitchen aspirin 81 MG chewable tablet Chew by mouth once a week.      Allergies  Allergen Reactions  . Augmentin [Amoxicillin-Pot Clavulanate] Diarrhea    GI upset- caused Colitis Did it involve swelling of the face/tongue/throat, SOB, or low BP? Unknown Did it involve sudden or severe rash/hives, skin peeling, or any reaction on the inside of your mouth or nose? Unknown Did you need to seek medical attention at a hospital or doctor's office? No When did it last happen?~15 years ago If all above answers are "  NO", may proceed with cephalosporin use.     Social History   Tobacco Use  . Smoking status: Former Smoker    Quit date: 12/15/1968    Years since quitting: 50.7  . Smokeless tobacco: Never Used  . Tobacco comment: quit age 45   Substance Use Topics  . Alcohol use: Yes    Comment: beer- occ    Family History  Problem Relation Age of Onset  . Colon cancer Neg Hx   . Colon polyps Neg Hx   . Esophageal cancer Neg Hx   . Rectal cancer Neg Hx   . Stomach cancer Neg Hx      Review of Systems  Musculoskeletal: Positive for arthralgias.       Right hip  All other systems reviewed and are  negative.   Objective:  Physical Exam  Constitutional: He is oriented to person, place, and time. He appears well-developed and well-nourished.  HENT:  Head: Normocephalic and atraumatic.  Eyes: Pupils are equal, round, and reactive to light.  Cardiovascular: Normal rate and regular rhythm.  Respiratory: Effort normal.  GI: Soft.  Musculoskeletal:     Cervical back: Normal range of motion.     Comments: Right hip motion is extremely limited and painful in both internal and external rotation.  Leg lengths look roughly equal.  He walks with an altered gait.  Sensation and motor function are intact distally with palpable pulses in his feet.    Neurological: He is alert and oriented to person, place, and time.  Skin: Skin is warm and dry.  Psychiatric: He has a normal mood and affect. His behavior is normal. Judgment and thought content normal.    Vital signs in last 24 hours:    Labs:   Estimated body mass index is 22.81 kg/m as calculated from the following:   Height as of 08/21/19: 5\' 8"  (1.727 m).   Weight as of 08/21/19: 68 kg.   Imaging Review Plain radiographs demonstrate severe degenerative joint disease of the right hip(s). The bone quality appears to be good for age and reported activity level.      Assessment/Plan:  End stage primary arthritis, right hip(s)  The patient history, physical examination, clinical judgement of the provider and imaging studies are consistent with end stage degenerative joint disease of the right hip(s) and total hip arthroplasty is deemed medically necessary. The treatment options including medical management, injection therapy, arthroscopy and arthroplasty were discussed at length. The risks and benefits of total hip arthroplasty were presented and reviewed. The risks due to aseptic loosening, infection, stiffness, dislocation/subluxation,  thromboembolic complications and other imponderables were discussed.  The patient acknowledged the  explanation, agreed to proceed with the plan and consent was signed. Patient is being admitted for inpatient treatment for surgery, pain control, PT, OT, prophylactic antibiotics, VTE prophylaxis, progressive ambulation and ADL's and discharge planning.The patient is planning to be discharged home with home health services

## 2019-08-31 MED ORDER — BUPIVACAINE LIPOSOME 1.3 % IJ SUSP
10.0000 mL | Freq: Once | INTRAMUSCULAR | Status: AC
  Filled 2019-08-31: qty 10

## 2019-08-31 MED ORDER — TRANEXAMIC ACID 1000 MG/10ML IV SOLN
2000.0000 mg | INTRAVENOUS | Status: AC
  Filled 2019-08-31: qty 20

## 2019-08-31 NOTE — Anesthesia Preprocedure Evaluation (Addendum)
Anesthesia Evaluation  Patient identified by MRN, date of birth, ID band Patient awake    Reviewed: Allergy & Precautions, NPO status , Patient's Chart, lab work & pertinent test results  History of Anesthesia Complications (+) PONV  Airway Mallampati: III  TM Distance: >3 FB Neck ROM: Full    Dental no notable dental hx. (+) Teeth Intact, Dental Advisory Given   Pulmonary neg pulmonary ROS, former smoker,    Pulmonary exam normal breath sounds clear to auscultation       Cardiovascular negative cardio ROS Normal cardiovascular exam Rhythm:Regular Rate:Normal     Neuro/Psych Anxiety negative neurological ROS  negative psych ROS   GI/Hepatic negative GI ROS, Neg liver ROS,   Endo/Other  negative endocrine ROS  Renal/GU negative Renal ROS  negative genitourinary   Musculoskeletal  (+) Arthritis ,   Abdominal   Peds  Hematology negative hematology ROS (+)   Anesthesia Other Findings   Reproductive/Obstetrics                            Anesthesia Physical Anesthesia Plan  ASA: II  Anesthesia Plan: Spinal   Post-op Pain Management:    Induction:   PONV Risk Score and Plan: 2 and Treatment may vary due to age or medical condition, Midazolam and Ondansetron  Airway Management Planned: Natural Airway  Additional Equipment:   Intra-op Plan:   Post-operative Plan:   Informed Consent: I have reviewed the patients History and Physical, chart, labs and discussed the procedure including the risks, benefits and alternatives for the proposed anesthesia with the patient or authorized representative who has indicated his/her understanding and acceptance.     Dental advisory given  Plan Discussed with: CRNA  Anesthesia Plan Comments:         Anesthesia Quick Evaluation

## 2019-09-01 ENCOUNTER — Other Ambulatory Visit: Payer: Self-pay

## 2019-09-01 ENCOUNTER — Ambulatory Visit (HOSPITAL_COMMUNITY): Payer: PPO | Admitting: Certified Registered"

## 2019-09-01 ENCOUNTER — Encounter (HOSPITAL_COMMUNITY)
Admission: RE | Disposition: A | Discharge status: Home / Self Care | Payer: Self-pay | Source: Home / Self Care | Attending: Orthopaedic Surgery

## 2019-09-01 ENCOUNTER — Ambulatory Visit (HOSPITAL_COMMUNITY): Payer: PPO

## 2019-09-01 ENCOUNTER — Encounter (HOSPITAL_COMMUNITY): Payer: Self-pay | Admitting: Orthopaedic Surgery

## 2019-09-01 ENCOUNTER — Observation Stay (HOSPITAL_COMMUNITY)
Admission: RE | Admit: 2019-09-01 | Discharge: 2019-09-02 | Disposition: A | Discharge status: Home / Self Care | Payer: PPO | Attending: Orthopaedic Surgery | Admitting: Orthopaedic Surgery

## 2019-09-01 DIAGNOSIS — R110 Nausea: Secondary | ICD-10-CM | POA: Diagnosis not present

## 2019-09-01 DIAGNOSIS — Z471 Aftercare following joint replacement surgery: Secondary | ICD-10-CM | POA: Diagnosis not present

## 2019-09-01 DIAGNOSIS — M1611 Unilateral primary osteoarthritis, right hip: Secondary | ICD-10-CM | POA: Diagnosis not present

## 2019-09-01 DIAGNOSIS — Z419 Encounter for procedure for purposes other than remedying health state, unspecified: Secondary | ICD-10-CM

## 2019-09-01 DIAGNOSIS — Z85828 Personal history of other malignant neoplasm of skin: Secondary | ICD-10-CM | POA: Diagnosis not present

## 2019-09-01 DIAGNOSIS — Z79890 Hormone replacement therapy (postmenopausal): Secondary | ICD-10-CM | POA: Insufficient documentation

## 2019-09-01 DIAGNOSIS — Z96641 Presence of right artificial hip joint: Secondary | ICD-10-CM | POA: Diagnosis not present

## 2019-09-01 DIAGNOSIS — Z87891 Personal history of nicotine dependence: Secondary | ICD-10-CM | POA: Diagnosis not present

## 2019-09-01 DIAGNOSIS — Z7982 Long term (current) use of aspirin: Secondary | ICD-10-CM | POA: Insufficient documentation

## 2019-09-01 HISTORY — PX: TOTAL HIP ARTHROPLASTY: SHX124

## 2019-09-01 SURGERY — ARTHROPLASTY, HIP, TOTAL, ANTERIOR APPROACH
Anesthesia: Spinal | Site: Hip | Laterality: Right

## 2019-09-01 MED ORDER — DEXAMETHASONE SODIUM PHOSPHATE 10 MG/ML IJ SOLN
INTRAMUSCULAR | Status: AC
  Filled 2019-09-01: qty 1

## 2019-09-01 MED ORDER — BISACODYL 5 MG PO TBEC: 5.0000 mg | DELAYED_RELEASE_TABLET | Freq: Every day | ORAL | Status: DC | PRN

## 2019-09-01 MED ORDER — HYDROCODONE-ACETAMINOPHEN 5-325 MG PO TABS
1.0000 | ORAL_TABLET | ORAL | Status: DC | PRN
  Administered 2019-09-01: 2 via ORAL
  Administered 2019-09-01 – 2019-09-02 (×2): 1 via ORAL
  Filled 2019-09-01: qty 2
  Filled 2019-09-01 (×3): qty 1

## 2019-09-01 MED ORDER — BUPIVACAINE IN DEXTROSE 0.75-8.25 % IT SOLN
INTRATHECAL | Status: DC | PRN
  Administered 2019-09-01: 1.8 mL via INTRATHECAL

## 2019-09-01 MED ORDER — MIDAZOLAM HCL 2 MG/2ML IJ SOLN
INTRAMUSCULAR | Status: AC
  Filled 2019-09-01: qty 2

## 2019-09-01 MED ORDER — VANCOMYCIN HCL IN DEXTROSE 1-5 GM/200ML-% IV SOLN
1000.0000 mg | Freq: Two times a day (BID) | INTRAVENOUS | Status: AC
  Administered 2019-09-01: 1000 mg via INTRAVENOUS
  Filled 2019-09-01: qty 200

## 2019-09-01 MED ORDER — PROPOFOL 500 MG/50ML IV EMUL
INTRAVENOUS | Status: DC | PRN
  Administered 2019-09-01: 50 ug/kg/min via INTRAVENOUS

## 2019-09-01 MED ORDER — FENTANYL CITRATE (PF) 100 MCG/2ML IJ SOLN
INTRAMUSCULAR | Status: DC | PRN
  Administered 2019-09-01: 50 ug via INTRAVENOUS

## 2019-09-01 MED ORDER — TRANEXAMIC ACID-NACL 1000-0.7 MG/100ML-% IV SOLN
1000.0000 mg | INTRAVENOUS | Status: AC
  Administered 2019-09-01: 1000 mg via INTRAVENOUS
  Filled 2019-09-01: qty 100

## 2019-09-01 MED ORDER — TRANEXAMIC ACID 1000 MG/10ML IV SOLN
INTRAVENOUS | Status: DC | PRN
  Administered 2019-09-01: 2000 mg via TOPICAL

## 2019-09-01 MED ORDER — ASPIRIN 81 MG PO CHEW
81.0000 mg | CHEWABLE_TABLET | Freq: Two times a day (BID) | ORAL | Status: DC
  Administered 2019-09-02: 81 mg via ORAL
  Filled 2019-09-01: qty 1

## 2019-09-01 MED ORDER — ALUM & MAG HYDROXIDE-SIMETH 200-200-20 MG/5ML PO SUSP: 30.0000 mL | ORAL | Status: DC | PRN

## 2019-09-01 MED ORDER — METHOCARBAMOL 500 MG IVPB - SIMPLE MED
INTRAVENOUS | Status: AC
  Filled 2019-09-01: qty 50

## 2019-09-01 MED ORDER — METHOCARBAMOL 500 MG IVPB - SIMPLE MED
500.0000 mg | Freq: Four times a day (QID) | INTRAVENOUS | Status: DC | PRN
  Administered 2019-09-01: 10:00:00 500 mg via INTRAVENOUS
  Filled 2019-09-01: qty 50

## 2019-09-01 MED ORDER — BUPIVACAINE-EPINEPHRINE (PF) 0.5% -1:200000 IJ SOLN
INTRAMUSCULAR | Status: DC | PRN
  Administered 2019-09-01: 30 mL via PERINEURAL

## 2019-09-01 MED ORDER — ACETAMINOPHEN 500 MG PO TABS
500.0000 mg | ORAL_TABLET | Freq: Four times a day (QID) | ORAL | Status: AC
  Administered 2019-09-01 – 2019-09-02 (×4): 500 mg via ORAL
  Filled 2019-09-01 (×4): qty 1

## 2019-09-01 MED ORDER — PHENOL 1.4 % MT LIQD: 1.0000 | OROMUCOSAL | Status: DC | PRN

## 2019-09-01 MED ORDER — DOCUSATE SODIUM 100 MG PO CAPS
100.0000 mg | ORAL_CAPSULE | Freq: Two times a day (BID) | ORAL | Status: DC
  Administered 2019-09-01 – 2019-09-02 (×2): 100 mg via ORAL
  Filled 2019-09-01 (×2): qty 1

## 2019-09-01 MED ORDER — ONDANSETRON HCL 4 MG/2ML IJ SOLN: 4.0000 mg | Freq: Four times a day (QID) | INTRAMUSCULAR | Status: DC | PRN

## 2019-09-01 MED ORDER — PROPOFOL 10 MG/ML IV BOLUS
INTRAVENOUS | Status: DC | PRN
  Administered 2019-09-01 (×2): 20 mg via INTRAVENOUS

## 2019-09-01 MED ORDER — ONDANSETRON HCL 4 MG PO TABS
4.0000 mg | ORAL_TABLET | Freq: Four times a day (QID) | ORAL | Status: DC | PRN
  Administered 2019-09-01 – 2019-09-02 (×3): 4 mg via ORAL
  Filled 2019-09-01 (×3): qty 1

## 2019-09-01 MED ORDER — PROPOFOL 1000 MG/100ML IV EMUL
INTRAVENOUS | Status: AC
  Filled 2019-09-01: qty 100

## 2019-09-01 MED ORDER — VANCOMYCIN HCL IN DEXTROSE 1-5 GM/200ML-% IV SOLN
1000.0000 mg | INTRAVENOUS | Status: AC
  Administered 2019-09-01: 1000 mg via INTRAVENOUS
  Filled 2019-09-01: qty 200

## 2019-09-01 MED ORDER — LACTATED RINGERS IV SOLN: INTRAVENOUS | Status: DC

## 2019-09-01 MED ORDER — METHOCARBAMOL 500 MG PO TABS
500.0000 mg | ORAL_TABLET | Freq: Four times a day (QID) | ORAL | Status: DC | PRN
  Administered 2019-09-01: 18:00:00 500 mg via ORAL
  Filled 2019-09-01: qty 1

## 2019-09-01 MED ORDER — METOCLOPRAMIDE HCL 5 MG PO TABS
5.0000 mg | ORAL_TABLET | Freq: Three times a day (TID) | ORAL | Status: DC | PRN
  Administered 2019-09-02: 10 mg via ORAL
  Filled 2019-09-01: qty 2

## 2019-09-01 MED ORDER — MENTHOL 3 MG MT LOZG: 1.0000 | LOZENGE | OROMUCOSAL | Status: DC | PRN

## 2019-09-01 MED ORDER — FENTANYL CITRATE (PF) 100 MCG/2ML IJ SOLN
INTRAMUSCULAR | Status: AC
  Filled 2019-09-01: qty 2

## 2019-09-01 MED ORDER — ACETAMINOPHEN 500 MG PO TABS
1000.0000 mg | ORAL_TABLET | Freq: Once | ORAL | Status: AC
  Administered 2019-09-01: 06:00:00 1000 mg via ORAL
  Filled 2019-09-01: qty 2

## 2019-09-01 MED ORDER — POVIDONE-IODINE 10 % EX SWAB
2.0000 "application " | Freq: Once | CUTANEOUS | Status: AC
  Administered 2019-09-01: 2 via TOPICAL

## 2019-09-01 MED ORDER — KETOROLAC TROMETHAMINE 15 MG/ML IJ SOLN
7.5000 mg | Freq: Four times a day (QID) | INTRAMUSCULAR | Status: AC
  Administered 2019-09-01 – 2019-09-02 (×4): 7.5 mg via INTRAVENOUS
  Filled 2019-09-01 (×4): qty 1

## 2019-09-01 MED ORDER — CHLORHEXIDINE GLUCONATE 4 % EX LIQD: 60.0000 mL | Freq: Once | CUTANEOUS | Status: DC

## 2019-09-01 MED ORDER — 0.9 % SODIUM CHLORIDE (POUR BTL) OPTIME
TOPICAL | Status: DC | PRN
  Administered 2019-09-01: 1000 mL

## 2019-09-01 MED ORDER — DEXAMETHASONE SODIUM PHOSPHATE 10 MG/ML IJ SOLN
INTRAMUSCULAR | Status: DC | PRN
  Administered 2019-09-01: 8 mg via INTRAVENOUS

## 2019-09-01 MED ORDER — MORPHINE SULFATE (PF) 2 MG/ML IV SOLN: 0.5000 mg | INTRAVENOUS | Status: DC | PRN

## 2019-09-01 MED ORDER — STERILE WATER FOR IRRIGATION IR SOLN
Status: DC | PRN
  Administered 2019-09-01: 2000 mL

## 2019-09-01 MED ORDER — EPHEDRINE SULFATE-NACL 50-0.9 MG/10ML-% IV SOSY
PREFILLED_SYRINGE | INTRAVENOUS | Status: DC | PRN
  Administered 2019-09-01: 5 mg via INTRAVENOUS
  Administered 2019-09-01: 10 mg via INTRAVENOUS
  Administered 2019-09-01: 5 mg via INTRAVENOUS

## 2019-09-01 MED ORDER — DIPHENHYDRAMINE HCL 12.5 MG/5ML PO ELIX: 12.5000 mg | ORAL_SOLUTION | ORAL | Status: DC | PRN

## 2019-09-01 MED ORDER — LORATADINE 10 MG PO TABS: 10.0000 mg | ORAL_TABLET | Freq: Every day | ORAL | Status: DC | PRN

## 2019-09-01 MED ORDER — BUPIVACAINE LIPOSOME 1.3 % IJ SUSP
INTRAMUSCULAR | Status: DC | PRN
  Administered 2019-09-01: 10 mL

## 2019-09-01 MED ORDER — BUPIVACAINE-EPINEPHRINE (PF) 0.5% -1:200000 IJ SOLN
INTRAMUSCULAR | Status: AC
  Filled 2019-09-01: qty 30

## 2019-09-01 MED ORDER — METOCLOPRAMIDE HCL 5 MG/ML IJ SOLN: 5.0000 mg | Freq: Three times a day (TID) | INTRAMUSCULAR | Status: DC | PRN

## 2019-09-01 MED ORDER — HYDROCODONE-ACETAMINOPHEN 7.5-325 MG PO TABS: 1.0000 | ORAL_TABLET | ORAL | Status: DC | PRN

## 2019-09-01 MED ORDER — TRANEXAMIC ACID-NACL 1000-0.7 MG/100ML-% IV SOLN
1000.0000 mg | Freq: Once | INTRAVENOUS | Status: AC
  Administered 2019-09-01: 12:00:00 1000 mg via INTRAVENOUS
  Filled 2019-09-01: qty 100

## 2019-09-01 MED ORDER — LEVOTHYROXINE SODIUM 25 MCG PO TABS
25.0000 ug | ORAL_TABLET | Freq: Every day | ORAL | Status: DC
  Filled 2019-09-01: qty 1

## 2019-09-01 MED ORDER — LIDOCAINE 2% (20 MG/ML) 5 ML SYRINGE
INTRAMUSCULAR | Status: DC | PRN
  Administered 2019-09-01: 40 mg via INTRAVENOUS

## 2019-09-01 MED ORDER — MIDAZOLAM HCL 2 MG/2ML IJ SOLN
INTRAMUSCULAR | Status: DC | PRN
  Administered 2019-09-01 (×2): 1 mg via INTRAVENOUS

## 2019-09-01 MED ORDER — PROPOFOL 10 MG/ML IV BOLUS
INTRAVENOUS | Status: AC
  Filled 2019-09-01: qty 20

## 2019-09-01 MED ORDER — ONDANSETRON HCL 4 MG/2ML IJ SOLN
INTRAMUSCULAR | Status: DC | PRN
  Administered 2019-09-01: 4 mg via INTRAVENOUS

## 2019-09-01 MED ORDER — LIDOCAINE 2% (20 MG/ML) 5 ML SYRINGE
INTRAMUSCULAR | Status: AC
  Filled 2019-09-01: qty 5

## 2019-09-01 MED ORDER — FENTANYL CITRATE (PF) 100 MCG/2ML IJ SOLN
25.0000 ug | INTRAMUSCULAR | Status: DC | PRN
  Administered 2019-09-01: 10:00:00 50 ug via INTRAVENOUS

## 2019-09-01 MED ORDER — ACETAMINOPHEN 325 MG PO TABS: 325.0000 mg | ORAL_TABLET | Freq: Four times a day (QID) | ORAL | Status: DC | PRN

## 2019-09-01 MED ORDER — PHENYLEPHRINE HCL (PRESSORS) 10 MG/ML IV SOLN
INTRAVENOUS | Status: AC
  Filled 2019-09-01: qty 1

## 2019-09-01 MED ORDER — ONDANSETRON HCL 4 MG/2ML IJ SOLN
INTRAMUSCULAR | Status: AC
  Filled 2019-09-01: qty 2

## 2019-09-01 MED ORDER — PHENYLEPHRINE HCL-NACL 10-0.9 MG/250ML-% IV SOLN
INTRAVENOUS | Status: DC | PRN
  Administered 2019-09-01: 20 ug/min via INTRAVENOUS

## 2019-09-01 SURGICAL SUPPLY — 44 items
BAG DECANTER FOR FLEXI CONT (MISCELLANEOUS) ×3 IMPLANT
BALL HIP CERAMIC 32MM PLUS 9 ×1 IMPLANT
BLADE SAW SGTL 18X1.27X75 (BLADE) ×2 IMPLANT
BLADE SAW SGTL 18X1.27X75MM (BLADE) ×1
BOOTIES KNEE HIGH SLOAN (MISCELLANEOUS) ×3 IMPLANT
CELLS DAT CNTRL 66122 CELL SVR (MISCELLANEOUS) ×1 IMPLANT
COVER PERINEAL POST (MISCELLANEOUS) ×3 IMPLANT
COVER SURGICAL LIGHT HANDLE (MISCELLANEOUS) ×3 IMPLANT
COVER WAND RF STERILE (DRAPES) IMPLANT
CUP GRIPTON 48MM 100 HIP (Hips) ×3 IMPLANT
DECANTER SPIKE VIAL GLASS SM (MISCELLANEOUS) ×3 IMPLANT
DRAPE IMP U-DRAPE 54X76 (DRAPES) ×3 IMPLANT
DRAPE STERI IOBAN 125X83 (DRAPES) ×3 IMPLANT
DRAPE U-SHAPE 47X51 STRL (DRAPES) ×6 IMPLANT
DRSG AQUACEL AG ADV 3.5X 6 (GAUZE/BANDAGES/DRESSINGS) ×3 IMPLANT
DURAPREP 26ML APPLICATOR (WOUND CARE) ×3 IMPLANT
ELECT BLADE TIP CTD 4 INCH (ELECTRODE) ×3 IMPLANT
ELECT REM PT RETURN 15FT ADLT (MISCELLANEOUS) ×3 IMPLANT
ELIMINATOR HOLE APEX DEPUY (Hips) ×3 IMPLANT
GLOVE BIO SURGEON STRL SZ8 (GLOVE) ×6 IMPLANT
GLOVE BIOGEL PI IND STRL 8 (GLOVE) ×2 IMPLANT
GLOVE BIOGEL PI INDICATOR 8 (GLOVE) ×4
GOWN STRL REUS W/TWL XL LVL3 (GOWN DISPOSABLE) ×6 IMPLANT
HIP BALL CERAMIC 32MM PLUS 9 ×3 IMPLANT
HOLDER FOLEY CATH W/STRAP (MISCELLANEOUS) ×3 IMPLANT
KIT TURNOVER KIT A (KITS) IMPLANT
MANIFOLD NEPTUNE II (INSTRUMENTS) ×3 IMPLANT
NEEDLE HYPO 22GX1.5 SAFETY (NEEDLE) ×3 IMPLANT
NS IRRIG 1000ML POUR BTL (IV SOLUTION) ×3 IMPLANT
PACK ANTERIOR HIP CUSTOM (KITS) ×3 IMPLANT
PENCIL SMOKE EVACUATOR (MISCELLANEOUS) IMPLANT
PINN ALTRX NEUT ID X OD 32X48 ×3 IMPLANT
PROTECTOR NERVE ULNAR (MISCELLANEOUS) ×3 IMPLANT
RTRCTR WOUND ALEXIS 18CM MED (MISCELLANEOUS) ×3
STEM FEMORAL SZ6 HIGH ACTIS (Stem) ×3 IMPLANT
SUT ETHIBOND NAB CT1 #1 30IN (SUTURE) ×6 IMPLANT
SUT VIC AB 1 CT1 36 (SUTURE) ×3 IMPLANT
SUT VIC AB 2-0 CT1 27 (SUTURE) ×3
SUT VIC AB 2-0 CT1 TAPERPNT 27 (SUTURE) ×1 IMPLANT
SUT VICRYL AB 3-0 FS1 BRD 27IN (SUTURE) ×3 IMPLANT
SUT VLOC 180 0 24IN GS25 (SUTURE) ×3 IMPLANT
SYR 50ML LL SCALE MARK (SYRINGE) ×3 IMPLANT
TRAY FOLEY MTR SLVR 16FR STAT (SET/KITS/TRAYS/PACK) ×3 IMPLANT
YANKAUER SUCT BULB TIP 10FT TU (MISCELLANEOUS) ×3 IMPLANT

## 2019-09-01 NOTE — Plan of Care (Signed)
  Problem: Clinical Measurements: Goal: Respiratory complications will improve Outcome: Progressing   Problem: Clinical Measurements: Goal: Cardiovascular complication will be avoided Outcome: Progressing   Problem: Activity: Goal: Risk for activity intolerance will decrease Outcome: Progressing   Problem: Elimination: Goal: Will not experience complications related to bowel motility Outcome: Progressing   Problem: Pain Managment: Goal: General experience of comfort will improve Outcome: Progressing   Problem: Skin Integrity: Goal: Risk for impaired skin integrity will decrease Outcome: Progressing   Problem: Education: Goal: Knowledge of the prescribed therapeutic regimen will improve Outcome: Progressing

## 2019-09-01 NOTE — Evaluation (Addendum)
Physical Therapy Evaluation Patient Details Name: Patrick Curtis MRN: [id number] DOB: 1950/06/18 Today's Date: 09/01/2019   History of Present Illness  Patient is 69 y.o. male s/p Rt THA anterior approach on 09/01/19 with PMH significant for OA, anxiety, diverticulosis, PNA, Rt RCR, Bil inguinal hernia repairs.     Clinical Impression  Patrick Curtis is a 69 y.o. male POD 0 s/p Rt THA anterior approach. Patient reports independence with mobility at baseline. Patient is now limited by functional impairments (see PT problem list below) and requires min assist/guard for transfers and gait with RW. Patient was able to ambulate ~110 feet with RW and min guard/assist. Patient instructed in exercise to facilitate ROM and circulation. Patient will benefit from continued skilled PT interventions to address impairments and progress towards PLOF. Acute PT will follow to progress mobility and stair training in preparation for safe discharge home.     Follow Up Recommendations Follow surgeon's recommendation for DC plan and follow-up therapies;Home health PT    Equipment Recommendations  Rolling walker with 5" wheels    Recommendations for Other Services       Precautions / Restrictions Precautions Precautions: Fall Restrictions Weight Bearing Restrictions: No      Mobility  Bed Mobility Overal bed mobility: Needs Assistance Bed Mobility: Supine to Sit     Supine to sit: Min assist;HOB elevated     General bed mobility comments: pt able to use bed rails to raise trunk, light assist needed to bring LE's off EOB  Transfers Overall transfer level: Needs assistance Equipment used: Rolling walker (2 wheeled) Transfers: Sit to/from Stand Sit to Stand: Min assist         General transfer comment: cues for technique with RW, light assist for power up and to steady with rising.  Ambulation/Gait Ambulation/Gait assistance: Min assist;Min guard Gait Distance (Feet): 120 Feet Assistive device:  Rolling walker (2 wheeled) Gait Pattern/deviations: Step-to pattern;Step-through pattern;Decreased stride length;Decreased weight shift to right Gait velocity: fair   General Gait Details: pt beginning with step to pattern and maitained safe proximity to walker and sequencing. no overt LOB noted. verbal cues for step through pattern and pt maintained safe proximity to walker and performed smoothly.  Stairs            Wheelchair Mobility    Modified Rankin (Stroke Patients Only)       Balance Overall balance assessment: Needs assistance Sitting-balance support: Feet supported Sitting balance-Leahy Scale: Good     Standing balance support: Bilateral upper extremity supported;During functional activity Standing balance-Leahy Scale: Fair          Pertinent Vitals/Pain Pain Assessment: 0-10 Pain Score: 4  Pain Location: R hip Pain Descriptors / Indicators: Aching;Dull;Discomfort Pain Intervention(s): Limited activity within patient's tolerance;Monitored during session;Repositioned    Home Living Family/patient expects to be discharged to:: Private residence Living Arrangements: Spouse/significant other Available Help at Discharge: Family Type of Home: House Home Access: Stairs to enter Entrance Stairs-Rails: None Entrance Stairs-Number of Steps: 3 Home Layout: Two level;Able to live on main level with bedroom/bathroom;Full bath on main level Home Equipment: Cane - single point      Prior Function Level of Independence: Independent               Hand Dominance   Dominant Hand: Right    Extremity/Trunk Assessment   Upper Extremity Assessment Upper Extremity Assessment: Overall WFL for tasks assessed    Lower Extremity Assessment Lower Extremity Assessment: Overall WFL for tasks assessed  Cervical / Trunk Assessment Cervical / Trunk Assessment: Normal  Communication   Communication: No difficulties  Cognition Arousal/Alertness:  Awake/alert Behavior During Therapy: WFL for tasks assessed/performed Overall Cognitive Status: Within Functional Limits for tasks assessed         General Comments      Exercises Total Joint Exercises Ankle Circles/Pumps: AROM;Both;20 reps;Seated Quad Sets: AROM;Right;10 reps;Seated Heel Slides: AROM;Right;10 reps;Seated   Assessment/Plan    PT Assessment Patient needs continued PT services  PT Problem List Decreased range of motion;Decreased strength;Decreased activity tolerance;Decreased balance;Decreased mobility;Decreased knowledge of use of DME;Decreased knowledge of precautions       PT Treatment Interventions Gait training;DME instruction;Stair training;Functional mobility training;Therapeutic activities;Therapeutic exercise;Balance training;Neuromuscular re-education;Patient/family education    PT Goals (Current goals can be found in the Care Plan section)  Acute Rehab PT Goals Patient Stated Goal: to get back to hiking PT Goal Formulation: With patient Time For Goal Achievement: 09/08/19 Potential to Achieve Goals: Good    Frequency 7X/week    AM-PAC PT "6 Clicks" Mobility  Outcome Measure Help needed turning from your back to your side while in a flat bed without using bedrails?: None Help needed moving from lying on your back to sitting on the side of a flat bed without using bedrails?: A Little Help needed moving to and from a bed to a chair (including a wheelchair)?: A Little Help needed standing up from a chair using your arms (e.g., wheelchair or bedside chair)?: A Little Help needed to walk in hospital room?: A Little Help needed climbing 3-5 steps with a railing? : A Little 6 Click Score: 19    End of Session Equipment Utilized During Treatment: Gait belt Activity Tolerance: Patient tolerated treatment well Patient left: in chair;with call bell/phone within reach;with family/visitor present Nurse Communication: Mobility status PT Visit Diagnosis:  Muscle weakness (generalized) (M62.81);Difficulty in walking, not elsewhere classified (R26.2)    Time: [id number] PT Time Calculation (min) (ACUTE ONLY): 23 min   Charges:   PT Evaluation $PT Eval Low Complexity: 1 Low PT Treatments $Gait Training: 8-22 mins        Verner Mould, DPT Physical Therapist with [facility] [phone]  09/01/2019 5:19 PM

## 2019-09-01 NOTE — Anesthesia Postprocedure Evaluation (Signed)
Anesthesia Post Note  Patient: Patrick Curtis  Procedure(s) Performed: RIGHT TOTAL HIP ARTHROPLASTY ANTERIOR APPROACH (Right Hip)     Patient location during evaluation: PACU Anesthesia Type: Spinal Level of consciousness: oriented and awake and alert Pain management: pain level controlled Vital Signs Assessment: post-procedure vital signs reviewed and stable Respiratory status: spontaneous breathing, respiratory function stable and patient connected to nasal cannula oxygen Cardiovascular status: blood pressure returned to baseline and stable Postop Assessment: no headache, no backache and no apparent nausea or vomiting Anesthetic complications: no    Last Vitals:  Vitals:   09/01/19 1030 09/01/19 1048  BP: 103/68 116/74  Pulse: (!) 59 63  Resp: 12 14  Temp: (!) 36.4 C 36.5 C  SpO2: 100% 91%    Last Pain:  Vitals:   09/01/19 1055  TempSrc:   PainSc: 3                  Shahmeer Bunn L Marjan Rosman

## 2019-09-01 NOTE — Interval H&P Note (Signed)
History and Physical Interval Note:  09/01/2019 7:27 AM  Patrick Curtis  has presented today for surgery, with the diagnosis of RIGHT HIP DEGENERATIVE JOINT DISEASE.  The various methods of treatment have been discussed with the patient and family. After consideration of risks, benefits and other options for treatment, the patient has consented to  Procedure(s): RIGHT TOTAL HIP ARTHROPLASTY ANTERIOR APPROACH (Right) as a surgical intervention.  The patient's history has been reviewed, patient examined, no change in status, stable for surgery.  I have reviewed the patient's chart and labs.  Questions were answered to the patient's satisfaction.     [name]

## 2019-09-01 NOTE — Anesthesia Procedure Notes (Signed)
Procedure Name: MAC Date/Time: 09/01/2019 7:33 AM Performed by: Eben Burow, CRNA Pre-anesthesia Checklist: Patient identified, Emergency Drugs available, Suction available, Patient being monitored and Timeout performed Oxygen Delivery Method: Simple face mask Dental Injury: Teeth and Oropharynx as per pre-operative assessment

## 2019-09-01 NOTE — Anesthesia Procedure Notes (Addendum)
Spinal  Patient location during procedure: OR Start time: 09/01/2019 7:39 AM Staffing Performed: resident/CRNA  Anesthesiologist: Freddrick March, MD Resident/CRNA: Eben Burow, CRNA Preanesthetic Checklist Completed: patient identified, IV checked, site marked, risks and benefits discussed, surgical consent, monitors and equipment checked, pre-op evaluation and timeout performed Spinal Block Patient position: sitting Prep: DuraPrep and site prepped and draped Patient monitoring: heart rate, cardiac monitor, continuous pulse ox and blood pressure Approach: midline Location: L3-4 Injection technique: single-shot Needle Needle type: Pencan  Needle gauge: 24 G Needle length: 9 cm Assessment Sensory level: T4 Additional Notes Pt placed in sitting position, spinal kit expiration date checked and verified, + CSF, - heme, pt tolerated well. Dr Lanetta Inch present and supervising throughout SAB placement.

## 2019-09-01 NOTE — Transfer of Care (Signed)
Immediate Anesthesia Transfer of Care Note  Patient: Patrick Curtis  Procedure(s) Performed: RIGHT TOTAL HIP ARTHROPLASTY ANTERIOR APPROACH (Right Hip)  Patient Location: PACU  Anesthesia Type:Spinal  Level of Consciousness: awake, alert  and oriented  Airway & Oxygen Therapy: Patient Spontanous Breathing and Patient connected to face mask oxygen  Post-op Assessment: Report given to RN and Post -op Vital signs reviewed and stable  Post vital signs: Reviewed and stable  Last Vitals:  Vitals Value Taken Time  BP 105/75 09/01/19 0927  Temp 36.5 C 09/01/19 0927  Pulse 80 09/01/19 0928  Resp 18 09/01/19 0929  SpO2 96 % 09/01/19 0928  Vitals shown include unvalidated device data.  Last Pain:  Vitals:   09/01/19 0628  TempSrc:   PainSc: 2       Patients Stated Pain Goal: 1 ([id number])  Complications: No apparent anesthesia complications

## 2019-09-01 NOTE — Op Note (Signed)

## 2019-09-02 ENCOUNTER — Encounter: Payer: Self-pay | Admitting: *Deleted

## 2019-09-02 DIAGNOSIS — M1611 Unilateral primary osteoarthritis, right hip: Secondary | ICD-10-CM | POA: Diagnosis not present

## 2019-09-02 MED ORDER — TIZANIDINE HCL 4 MG PO TABS: 4.0000 mg | ORAL_TABLET | Freq: Four times a day (QID) | ORAL | 1 refills | Status: AC | PRN

## 2019-09-02 MED ORDER — HYDROCODONE-ACETAMINOPHEN 5-325 MG PO TABS: 1.0000 | ORAL_TABLET | Freq: Four times a day (QID) | ORAL | 0 refills | Status: AC | PRN

## 2019-09-02 MED ORDER — ASPIRIN 81 MG PO CHEW: 81.0000 mg | CHEWABLE_TABLET | Freq: Two times a day (BID) | ORAL | 0 refills | Status: AC

## 2019-09-02 MED ORDER — ONDANSETRON HCL 4 MG PO TABS: 4.0000 mg | ORAL_TABLET | Freq: Four times a day (QID) | ORAL | 1 refills | Status: AC | PRN

## 2019-09-02 NOTE — Progress Notes (Signed)
Patient discharged to home w/ family. Given all belongings, instructions, equipment. Verbalized understanding of all instructions. Escorted to pov via w/c. 

## 2019-09-02 NOTE — Progress Notes (Signed)
Subjective: 1 Day Post-Op Procedure(s) (LRB): RIGHT TOTAL HIP ARTHROPLASTY ANTERIOR APPROACH (Right)   Patient feels well and is looking forward to going home. He thinks the pain meds cause nausea so he is asking for and nausea medicine to help with that.   Activity level:  wbat Diet tolerance:  ok Voiding:  ok Patient reports pain as mild.    Objective: Vital signs in last 24 hours: Temp:  [97.5 F (36.4 C)-98.8 F (37.1 C)] 98.8 F (37.1 C) (04/21 0532) Pulse Rate:  [58-99] 62 (04/21 0532) Resp:  [11-20] 18 (04/21 0532) BP: (98-123)/(57-77) 106/60 (04/21 0532) SpO2:  [91 %-100 %] 94 % (04/21 0532)  Labs: No results for input(s): HGB in the last 72 hours. No results for input(s): WBC, RBC, HCT, PLT in the last 72 hours. No results for input(s): NA, K, CL, CO2, BUN, CREATININE, GLUCOSE, CALCIUM in the last 72 hours. No results for input(s): LABPT, INR in the last 72 hours.  Physical Exam:  Neurologically intact ABD soft Neurovascular intact Sensation intact distally Intact pulses distally Dorsiflexion/Plantar flexion intact Incision: dressing C/D/I and no drainage No cellulitis present Compartment soft  Assessment/Plan:  1 Day Post-Op Procedure(s) (LRB): RIGHT TOTAL HIP ARTHROPLASTY ANTERIOR APPROACH (Right) Advance diet Up with therapy D/C IV fluids Discharge home with home health today after PT if cleared and doing well. Continue on asa 81mg  BID x 4 weeks post op. Follow up in office 2 weeks post op.    Patrick Curtis 09/02/2019, 7:55 AM

## 2019-09-02 NOTE — Progress Notes (Signed)
Physical Therapy Treatment Patient Details Name: Patrick Curtis MRN: [id number] DOB: Dec 03, 1950 Today's Date: 09/02/2019    History of Present Illness Patient is 69 y.o. male s/p Rt THA anterior approach on 09/01/19 with PMH significant for OA, anxiety, diverticulosis, PNA, Rt RCR, Bil inguinal hernia repairs.     PT Comments    Patient is making excellent progress with therapy and had good carryover for safe technique with RW for transfers and gait. No assist required for bed mobility transfers and pt able to complete sit<>stand power and maintain balance with supervision. Pt increased ambulation distance to ~ 400' with no overt LOB and was instructed in safe stair mobility. Pt completed with good step sequencing and verbalized safe guarding position for spouse to assist. Patrick Curtis would like an additional session as she wishes to be here to see how stairs are performed. Patient was instructed in exercises for HEP and is planning to have follow up HHPT. Will follow up for additional stair training today when pt's spouse arrives in preparation for safe discharge home.    Follow Up Recommendations  Follow surgeon's recommendation for DC plan and follow-up therapies;Home health PT     Equipment Recommendations  Rolling walker with 5" wheels    Recommendations for Other Services       Precautions / Restrictions Precautions Precautions: Fall Restrictions Weight Bearing Restrictions: No Other Position/Activity Restrictions: WBAT    Mobility  Bed Mobility Overal bed mobility: Needs Assistance Bed Mobility: Supine to Sit     Supine to sit: Supervision     General bed mobility comments: pt requires some increased time but was able to complete supine to sit transfer from flat bed with no use of bed rail.   Transfers Overall transfer level: Needs assistance Equipment used: Rolling walker (2 wheeled) Transfers: Sit to/from Stand Sit to Stand: Supervision         General transfer  comment: pt with good carryover for technique with RW, no assist required for power up and pt steady with rising.  Ambulation/Gait Ambulation/Gait assistance: Min guard;Supervision Gait Distance (Feet): 420 Feet Assistive device: Rolling walker (2 wheeled) Gait Pattern/deviations: Step-through pattern;Decreased stride length;Decreased weight shift to right Gait velocity: fair   General Gait Details: pt maintained safe step pattern and safe proximity to RW throughout. Pt first ambulated short distance to bathroom and after completing toileting and ADL's pt was able to ambulate to rehab gym for stair training. No overt LOB noted.   Stairs Stairs: Yes Stairs assistance: Min assist Stair Management: No rails;With walker;Backwards;Step to pattern Number of Stairs: 3 General stair comments: Pt instructed on safe step pattern for stair mobility "up with good, down wiht bad" and instructed on RW management as well as safe guarding position. Pt required min assist to steady walker, no overt LOB noted and good sequencing following demonstration.    Wheelchair Mobility    Modified Rankin (Stroke Patients Only)       Balance Overall balance assessment: Needs assistance Sitting-balance support: Feet supported Sitting balance-Leahy Scale: Good     Standing balance support: Bilateral upper extremity supported;During functional activity Standing balance-Leahy Scale: Fair Standing balance comment: pt able to maintain balance in bathroom to void bladder and to brush Patrick teeth.        Cognition Arousal/Alertness: Awake/alert Behavior During Therapy: WFL for tasks assessed/performed Overall Cognitive Status: Within Functional Limits for tasks assessed         Exercises Total Joint Exercises Ankle Circles/Pumps: AROM;Both;20 reps;Seated Quad Sets:  AROM;Right;Seated;5 reps Short Arc Quad: AROM;Right;Seated;5 reps Heel Slides: AROM;Right;Seated;5 reps Hip ABduction/ADduction:  AROM;Right;Seated;5 reps Long Arc Quad: AROM;Right;Seated;5 reps    General Comments        Pertinent Vitals/Pain Pain Assessment: No/denies pain           PT Goals (current goals can now be found in the care plan section) Acute Rehab PT Goals Patient Stated Goal: to get back to hiking PT Goal Formulation: With patient Time For Goal Achievement: 09/08/19 Potential to Achieve Goals: Good Progress towards PT goals: Progressing toward goals    Frequency    7X/week      PT Plan Current plan remains appropriate       AM-PAC PT "6 Clicks" Mobility   Outcome Measure  Help needed turning from your back to your side while in a flat bed without using bedrails?: None Help needed moving from lying on your back to sitting on the side of a flat bed without using bedrails?: A Little Help needed moving to and from a bed to a chair (including a wheelchair)?: A Little Help needed standing up from a chair using your arms (e.g., wheelchair or bedside chair)?: A Little Help needed to walk in hospital room?: A Little Help needed climbing 3-5 steps with a railing? : A Little 6 Click Score: 19    End of Session Equipment Utilized During Treatment: Gait belt Activity Tolerance: Patient tolerated treatment well Patient left: in chair;with call bell/phone within reach;with chair alarm set;with family/visitor present Nurse Communication: Mobility status PT Visit Diagnosis: Muscle weakness (generalized) (M62.81);Difficulty in walking, not elsewhere classified (R26.2)     Time: 0820-0850 PT Time Calculation (min) (ACUTE ONLY): 30 min  Charges:  $Gait Training: 8-22 mins $Therapeutic Exercise: 8-22 mins                     Verner Mould, DPT Physical Therapist with [facility] [phone]  09/02/2019 10:49 AM

## 2019-09-02 NOTE — Discharge Summary (Signed)
Patient ID: Patrick Curtis MRN: [id number] DOB/AGE: 1950/09/21 69 y.o.  Admit date: 09/01/2019 Discharge date: 09/02/2019  Admission Diagnoses:  Principal Problem:   Primary localized osteoarthritis of right hip Active Problems:   Primary osteoarthritis of right hip   Discharge Diagnoses:  Same  Past Medical History:  Diagnosis Date  . Allergy   . Anxiety   . Arthritis   . Cancer ([place])    skin cancer  . Diverticulosis    mild   . Family history of adverse reaction to anesthesia   . Internal hemorrhoids    small   . Pneumonia   . PONV (postoperative nausea and vomiting)     Surgeries: Procedure(s): RIGHT TOTAL HIP ARTHROPLASTY ANTERIOR APPROACH on 09/01/2019   Consultants:   Discharged Condition: Improved  Hospital Course: Patrick Curtis is an 69 y.o. male who was admitted 09/01/2019 for operative treatment ofPrimary localized osteoarthritis of right hip. Patient has severe unremitting pain that affects sleep, daily activities, and work/hobbies. After pre-op clearance the patient was taken to the operating room on 09/01/2019 and underwent  Procedure(s): RIGHT TOTAL HIP ARTHROPLASTY ANTERIOR APPROACH.    Patient was given perioperative antibiotics:  Anti-infectives (From admission, onward)   Start     Dose/Rate Route Frequency Ordered Stop   09/01/19 2000  vancomycin (VANCOCIN) IVPB 1000 mg/200 mL premix     1,000 mg 200 mL/hr over 60 Minutes Intravenous Every 12 hours 09/01/19 1052 09/01/19 2155   09/01/19 0600  vancomycin (VANCOCIN) IVPB 1000 mg/200 mL premix     1,000 mg 200 mL/hr over 60 Minutes Intravenous On call to O.R. 09/01/19 0541 09/01/19 0737       Patient was given sequential compression devices, early ambulation, and chemoprophylaxis to prevent DVT.  Patient benefited maximally from hospital stay and there were no complications.    Recent vital signs:  Patient Vitals for the past 24 hrs:  BP Temp Temp src Pulse Resp SpO2  09/02/19 0532 106/60 98.8 F  (37.1 C) Oral 62 18 94 %  09/02/19 0500 104/67 -- -- -- -- --  09/02/19 0143 (!) 98/57 98.1 F (36.7 C) Oral 64 18 97 %  09/01/19 2209 109/71 98.7 F (37.1 C) Oral 67 18 97 %  09/01/19 1732 117/71 98.8 F (37.1 C) Oral 78 16 100 %  09/01/19 1357 117/77 97.8 F (36.6 C) Oral 68 16 98 %  09/01/19 1257 123/75 97.9 F (36.6 C) Oral 68 16 98 %  09/01/19 1157 118/72 -- -- 66 16 99 %  09/01/19 1048 116/74 97.7 F (36.5 C) Oral 63 14 91 %  09/01/19 1030 103/68 (!) 97.5 F (36.4 C) -- (!) 59 12 100 %  09/01/19 1015 108/70 -- -- (!) 58 11 94 %  09/01/19 1000 111/71 -- -- 63 18 96 %  09/01/19 0945 102/66 -- -- 72 13 93 %  09/01/19 0930 108/66 -- -- 80 16 97 %  09/01/19 0927 105/75 97.7 F (36.5 C) -- 99 20 96 %     Recent laboratory studies: No results for input(s): WBC, HGB, HCT, PLT, NA, K, CL, CO2, BUN, CREATININE, GLUCOSE, INR, CALCIUM in the last 72 hours.  Invalid input(s): PT, 2   Discharge Medications:   Allergies as of 09/02/2019      Reactions   Augmentin [amoxicillin-pot Clavulanate] Diarrhea   GI upset- caused Colitis Did it involve swelling of the face/tongue/throat, SOB, or low BP? Unknown Did it involve sudden or severe rash/hives, skin peeling, or  any reaction on the inside of your mouth or nose? Unknown Did you need to seek medical attention at a hospital or doctor's office? No When did it last happen?~15 years ago If all above answers are "NO", may proceed with cephalosporin use.      Medication List    TAKE these medications   ammonium lactate 12 % cream Commonly known as: AMLACTIN Apply 1 g topically 3 (three) times daily as needed for dry skin (skin irritation/rash).   aspirin 81 MG chewable tablet Chew 1 tablet (81 mg total) by mouth 2 (two) times daily after a meal. What changed:   how much to take  when to take this   CINNAMON PO Take 1 tablet by mouth daily.   fluticasone 50 MCG/ACT nasal spray Commonly known as: FLONASE Place into both  nostrils as needed for allergies or rhinitis.   HYDROcodone-acetaminophen 5-325 MG tablet Commonly known as: NORCO/VICODIN Take 1-2 tablets by mouth every 6 (six) hours as needed for moderate pain (post op pain).   levothyroxine 25 MCG tablet Commonly known as: SYNTHROID Take 25 mcg by mouth daily at 2 am. (0330-0400)   loratadine 10 MG tablet Commonly known as: CLARITIN Take 10 mg by mouth daily as needed (allergies).   ondansetron 4 MG tablet Commonly known as: ZOFRAN Take 1 tablet (4 mg total) by mouth every 6 (six) hours as needed for nausea or vomiting.   PROSTATE PO Take 1 tablet by mouth in the morning and at bedtime. Prostate 5LX   tiZANidine 4 MG tablet Commonly known as: Zanaflex Take 1 tablet (4 mg total) by mouth every 6 (six) hours as needed for muscle spasms.   triamcinolone cream 0.1 % Commonly known as: KENALOG Apply 1 application topically 2 (two) times daily as needed (skin irritation/rash.).            Durable Medical Equipment  (From admission, onward)         Start     Ordered   09/01/19 1052  DME Walker rolling  Once    Question:  Patient needs a walker to treat with the following condition  Answer:  Primary osteoarthritis of right hip   09/01/19 1052   09/01/19 1052  DME 3 n 1  Once     09/01/19 1052   09/01/19 1052  DME Bedside commode  Once    Question:  Patient needs a bedside commode to treat with the following condition  Answer:  Primary osteoarthritis of right hip   09/01/19 1052          Diagnostic Studies: DG Chest 2 View  Result Date: 08/21/2019 CLINICAL DATA:  Preop for left hip surgery. EXAM: CHEST - 2 VIEW COMPARISON:  None. FINDINGS: The heart size and mediastinal contours are within normal limits. Both lungs are clear. The visualized skeletal structures are unremarkable. IMPRESSION: No active cardiopulmonary disease. Electronically Signed   By: Marijo Conception M.D.   On: 08/21/2019 16:50   DG C-Arm 1-60 Min-No  Report  Result Date: 09/01/2019 Fluoroscopy was utilized by the requesting physician.  No radiographic interpretation.   DG HIP OPERATIVE UNILAT WITH PELVIS RIGHT  Result Date: 09/01/2019 CLINICAL DATA:  RIGHT anterior hip replacement EXAM: OPERATIVE RIGHT HIP (WITH PELVIS IF PERFORMED) 3 VIEWS TECHNIQUE: Fluoroscopic spot image(s) were submitted for interpretation post-operatively. COMPARISON:  None FLUOROSCOPY TIME:  0 minutes 34 seconds FINDINGS: Osseous demineralization. Advanced osteoarthritic changes of RIGHT hip joint. Subsequent images demonstrate a RIGHT hip prosthesis without acute  fracture or dislocation. Mild narrowing of LEFT hip joint as well. IMPRESSION: RIGHT hip replacement for osteoarthritis. No acute abnormalities. Electronically Signed   By: Lavonia Dana M.D.   On: 09/01/2019 11:04    Disposition: Discharge disposition: 01-Home or Self Care       Discharge Instructions    Call MD / Call 911   Complete by: As directed    If you experience chest pain or shortness of breath, CALL 911 and be transported to the hospital emergency room.  If you develope a fever above 101 F, pus (white drainage) or increased drainage or redness at the wound, or calf pain, call your surgeon's office.   Constipation Prevention   Complete by: As directed    Drink plenty of fluids.  Prune juice may be helpful.  You may use a stool softener, such as Colace (over the counter) 100 mg twice a day.  Use MiraLax (over the counter) for constipation as needed.   Diet - low sodium heart healthy   Complete by: As directed    Discharge instructions   Complete by: As directed    INSTRUCTIONS AFTER JOINT REPLACEMENT   Remove items at home which could result in a fall. This includes throw rugs or furniture in walking pathways ICE to the affected joint every three hours while awake for 30 minutes at a time, for at least the first 3-5 days, and then as needed for pain and swelling.  Continue to use ice for pain  and swelling. You may notice swelling that will progress down to the foot and ankle.  This is normal after surgery.  Elevate your leg when you are not up walking on it.   Continue to use the breathing machine you got in the hospital (incentive spirometer) which will help keep your temperature down.  It is common for your temperature to cycle up and down following surgery, especially at night when you are not up moving around and exerting yourself.  The breathing machine keeps your lungs expanded and your temperature down.   DIET:  As you were doing prior to hospitalization, we recommend a well-balanced diet.  DRESSING / WOUND CARE / SHOWERING  You may shower 3 days after surgery, but keep the wounds dry during showering.  You may use an occlusive plastic wrap (Press'n Seal for example), NO SOAKING/SUBMERGING IN THE BATHTUB.  If the bandage gets wet, change with a clean dry gauze.  If the incision gets wet, pat the wound dry with a clean towel.  ACTIVITY  Increase activity slowly as tolerated, but follow the weight bearing instructions below.   No driving for 6 weeks or until further direction given by your physician.  You cannot drive while taking narcotics.  No lifting or carrying greater than 10 lbs. until further directed by your surgeon. Avoid periods of inactivity such as sitting longer than an hour when not asleep. This helps prevent blood clots.  You may return to work once you are authorized by your doctor.     WEIGHT BEARING   Weight bearing as tolerated with assist device (walker, cane, etc) as directed, use it as long as suggested by your surgeon or therapist, typically at least 4-6 weeks.   EXERCISES  Results after joint replacement surgery are often greatly improved when you follow the exercise, range of motion and muscle strengthening exercises prescribed by your doctor. Safety measures are also important to protect the joint from further injury. Any time any of these  exercises cause you to have increased pain or swelling, decrease what you are doing until you are comfortable again and then slowly increase them. If you have problems or questions, call your caregiver or physical therapist for advice.   Rehabilitation is important following a joint replacement. After just a few days of immobilization, the muscles of the leg can become weakened and shrink (atrophy).  These exercises are designed to build up the tone and strength of the thigh and leg muscles and to improve motion. Often times heat used for twenty to thirty minutes before working out will loosen up your tissues and help with improving the range of motion but do not use heat for the first two weeks following surgery (sometimes heat can increase post-operative swelling).   These exercises can be done on a training (exercise) mat, on the floor, on a table or on a bed. Use whatever works the best and is most comfortable for you.    Use music or television while you are exercising so that the exercises are a pleasant break in your day. This will make your life better with the exercises acting as a break in your routine that you can look forward to.   Perform all exercises about fifteen times, three times per day or as directed.  You should exercise both the operative leg and the other leg as well.   Exercises include:   Quad Sets - Tighten up the muscle on the front of the thigh (Quad) and hold for 5-10 seconds.   Straight Leg Raises - With your knee straight (if you were given a brace, keep it on), lift the leg to 60 degrees, hold for 3 seconds, and slowly lower the leg.  Perform this exercise against resistance later as your leg gets stronger.  Leg Slides: Lying on your back, slowly slide your foot toward your buttocks, bending your knee up off the floor (only go as far as is comfortable). Then slowly slide your foot back down until your leg is flat on the floor again.  Angel Wings: Lying on your back spread  your legs to the side as far apart as you can without causing discomfort.  Hamstring Strength:  Lying on your back, push your heel against the floor with your leg straight by tightening up the muscles of your buttocks.  Repeat, but this time bend your knee to a comfortable angle, and push your heel against the floor.  You may put a pillow under the heel to make it more comfortable if necessary.   A rehabilitation program following joint replacement surgery can speed recovery and prevent re-injury in the future due to weakened muscles. Contact your doctor or a physical therapist for more information on knee rehabilitation.    CONSTIPATION  Constipation is defined medically as fewer than three stools per week and severe constipation as less than one stool per week.  Even if you have a regular bowel pattern at home, your normal regimen is likely to be disrupted due to multiple reasons following surgery.  Combination of anesthesia, postoperative narcotics, change in appetite and fluid intake all can affect your bowels.   YOU MUST use at least one of the following options; they are listed in order of increasing strength to get the job done.  They are all available over the counter, and you may need to use some, POSSIBLY even all of these options:    Drink plenty of fluids (prune juice may be helpful) and high fiber foods  Colace 100 mg by mouth twice a day  Senokot for constipation as directed and as needed Dulcolax (bisacodyl), take with full glass of water  Miralax (polyethylene glycol) once or twice a day as needed.  If you have tried all these things and are unable to have a bowel movement in the first 3-4 days after surgery call either your surgeon or your primary doctor.    If you experience loose stools or diarrhea, hold the medications until you stool forms back up.  If your symptoms do not get better within 1 week or if they get worse, check with your doctor.  If you experience "the worst  abdominal pain ever" or develop nausea or vomiting, please contact the office immediately for further recommendations for treatment.   ITCHING:  If you experience itching with your medications, try taking only a single pain pill, or even half a pain pill at a time.  You can also use Benadryl over the counter for itching or also to help with sleep.   TED HOSE STOCKINGS:  Use stockings on both legs until for at least 2 weeks or as directed by physician office. They may be removed at night for sleeping.  MEDICATIONS:  See your medication summary on the "After Visit Summary" that nursing will review with you.  You may have some home medications which will be placed on hold until you complete the course of blood thinner medication.  It is important for you to complete the blood thinner medication as prescribed.  PRECAUTIONS:  If you experience chest pain or shortness of breath - call 911 immediately for transfer to the hospital emergency department.   If you develop a fever greater that 101 F, purulent drainage from wound, increased redness or drainage from wound, foul odor from the wound/dressing, or calf pain - CONTACT YOUR SURGEON.                                                   FOLLOW-UP APPOINTMENTS:  If you do not already have a post-op appointment, please call the office for an appointment to be seen by your surgeon.  Guidelines for how soon to be seen are listed in your "After Visit Summary", but are typically between 1-4 weeks after surgery.  OTHER INSTRUCTIONS:   Knee Replacement:  Do not place pillow under knee, focus on keeping the knee straight while resting. CPM instructions: 0-90 degrees, 2 hours in the morning, 2 hours in the afternoon, and 2 hours in the evening. Place foam block, curve side up under heel at all times except when in CPM or when walking.  DO NOT modify, tear, cut, or change the foam block in any way.   DENTAL ANTIBIOTICS:  In most cases prophylactic antibiotics for  Dental procdeures after total joint surgery are not necessary.  Exceptions are as follows:  1. History of prior total joint infection  2. Severely immunocompromised (Organ Transplant, cancer chemotherapy, Rheumatoid biologic meds such as Zebulon)  3. Poorly controlled diabetes (A1C &gt; 8.0, blood glucose over 200)  If you have one of these conditions, contact your surgeon for an antibiotic prescription, prior to your dental procedure.   MAKE SURE YOU:  Understand these instructions.  Get help right away if you are not doing well or get worse.    Thank you for letting us be  a part of your medical care team.  It is a privilege we respect greatly.  We hope these instructions will help you stay on track for a fast and full recovery!   Increase activity slowly as tolerated   Complete by: As directed       Follow-up Information    Melrose Nakayama, MD. Schedule an appointment as soon as possible for a visit in 2 weeks.   Specialty: Orthopedic Surgery Contact information: [place] [address] [phone]            Signed: Larwance Sachs Percy Winterrowd 09/02/2019, 8:00 AM

## 2019-09-02 NOTE — TOC Progression Note (Signed)
Transition of Care [facility]) - Progression Note    Patient Details  Name: CORVIN BOGUS MRN: [id number] Date of Birth: 04-13-1951  Transition of Care [facility]) CM/SW [place], LCSW Phone Number: 09/02/2019, 9:50 AM  Clinical Narrative:    Therapy Plan: [facility] ordered through [facility], declined 3 in 1     Barriers to Discharge: No Barriers Identified  Expected Discharge Plan and Services           Expected Discharge Date: 09/02/19               DME Arranged: Gilford Rile rolling DME Agency: [employer] Date DME Agency Contacted: 09/02/19 Time DME Agency Contacted: 0900 Representative spoke with at DME Agency: [place]: PT [place]: [facility] [employer]) Date [place]: 09/02/19 Time [place]: [phone] Representative spoke with at [place]: [place] ([place]) Interventions    Readmission Risk Interventions No flowsheet data found.

## 2019-09-02 NOTE — Progress Notes (Signed)
Patient refused Norco at time of rest of am meds. RN forgot to return med to pyxis prior to d/c. Wasted in stericycle w/ Hoy Register.

## 2019-09-02 NOTE — Progress Notes (Signed)
Physical Therapy Treatment Patient Details Name: Patrick Curtis MRN: [id number] DOB: 01-10-1951 Today's Date: 09/02/2019    History of Present Illness Patient is 69 y.o. male s/p Rt THA anterior approach on 09/01/19 with PMH significant for OA, anxiety, diverticulosis, PNA, Rt RCR, Bil inguinal hernia repairs.     PT Comments    Patient seen for additional acute PT session to provide further gait and stair training to patient and spouse for safe guarding. Patient continues to demonstrate safe technique with RW for transfers and gait requiring supervision only. Pt's wife demonstrated safe guarding technique for gait this session. Pt able to instruct his wife in safe guarding/assist for stair mobility and demonstrated good sequencing. No overt LOB and pt's wife provided safe level of assist. At this time pt is ready/saf for discharge home with assist from wife and plans on follow up HHPT to progress mobility. Acute PT will continue to progress pt as able.    Follow Up Recommendations  Follow surgeon's recommendation for DC plan and follow-up therapies;Home health PT     Equipment Recommendations  Rolling walker with 5" wheels    Recommendations for Other Services       Precautions / Restrictions Precautions Precautions: Fall Restrictions Weight Bearing Restrictions: No Other Position/Activity Restrictions: WBAT    Mobility  Bed Mobility Overal bed mobility: Needs Assistance Bed Mobility: Supine to Sit     Supine to sit: Supervision     General bed mobility comments: pt in recliner at start.  Transfers Overall transfer level: Needs assistance Equipment used: Rolling walker (2 wheeled) Transfers: Sit to/from Stand Sit to Stand: Supervision         General transfer comment: pt with good carryover for technique with RW, no assist required for power up and pt steady with rising.  Ambulation/Gait Ambulation/Gait assistance: Min guard;Supervision Gait Distance (Feet): 65  Feet Assistive device: Rolling walker (2 wheeled) Gait Pattern/deviations: Step-through pattern;Decreased stride length;Decreased weight shift to right Gait velocity: fair   General Gait Details: pt maintained safe step pattern and safe proximity to RW throughout. Pt's wife present and instructed in safe guarding position for gait. Pt's wife demosntrated good technique. No overt LOB noted with pt or wife while mobilizing.   Stairs Stairs: Yes Stairs assistance: Min guard;Supervision Stair Management: No rails;With walker;Backwards;Step to pattern Number of Stairs: 3 General stair comments: Pt able to instruct his wife on safe guarding and assist for reverse step up technique with RW. No overt LOB noted and pt's wife provided appropriate and safe assist throughout.    Wheelchair Mobility    Modified Rankin (Stroke Patients Only)       Balance Overall balance assessment: Needs assistance Sitting-balance support: Feet supported Sitting balance-Leahy Scale: Good     Standing balance support: Bilateral upper extremity supported;During functional activity Standing balance-Leahy Scale: Fair             Cognition Arousal/Alertness: Awake/alert Behavior During Therapy: WFL for tasks assessed/performed Overall Cognitive Status: Within Functional Limits for tasks assessed                General Comments        Pertinent Vitals/Pain Pain Assessment: No/denies pain           PT Goals (current goals can now be found in the care plan section) Acute Rehab PT Goals Patient Stated Goal: to get back to hiking PT Goal Formulation: With patient Time For Goal Achievement: 09/08/19 Potential to Achieve Goals: Good Progress towards PT  goals: Progressing toward goals    Frequency    7X/week      PT Plan Current plan remains appropriate       AM-PAC PT "6 Clicks" Mobility   Outcome Measure  Help needed turning from your back to your side while in a flat bed  without using bedrails?: None Help needed moving from lying on your back to sitting on the side of a flat bed without using bedrails?: A Little Help needed moving to and from a bed to a chair (including a wheelchair)?: A Little Help needed standing up from a chair using your arms (e.g., wheelchair or bedside chair)?: A Little Help needed to walk in hospital room?: A Little Help needed climbing 3-5 steps with a railing? : A Little 6 Click Score: 19    End of Session Equipment Utilized During Treatment: Gait belt Activity Tolerance: Patient tolerated treatment well Patient left: in chair;with call bell/phone within reach;with family/visitor present Nurse Communication: Mobility status PT Visit Diagnosis: Muscle weakness (generalized) (M62.81);Difficulty in walking, not elsewhere classified (R26.2)     Time: [id number] PT Time Calculation (min) (ACUTE ONLY): 10 min  Charges:  $Gait Training: 8-22 mins                      Verner Mould, DPT Physical Therapist with [facility] [phone]  09/02/2019 10:53 AM

## 2019-09-04 DIAGNOSIS — K648 Other hemorrhoids: Secondary | ICD-10-CM | POA: Diagnosis not present

## 2019-09-04 DIAGNOSIS — Z87891 Personal history of nicotine dependence: Secondary | ICD-10-CM | POA: Diagnosis not present

## 2019-09-04 DIAGNOSIS — Z85828 Personal history of other malignant neoplasm of skin: Secondary | ICD-10-CM | POA: Diagnosis not present

## 2019-09-04 DIAGNOSIS — F419 Anxiety disorder, unspecified: Secondary | ICD-10-CM | POA: Diagnosis not present

## 2019-09-04 DIAGNOSIS — K5790 Diverticulosis of intestine, part unspecified, without perforation or abscess without bleeding: Secondary | ICD-10-CM | POA: Diagnosis not present

## 2019-09-04 DIAGNOSIS — Z96641 Presence of right artificial hip joint: Secondary | ICD-10-CM | POA: Diagnosis not present

## 2019-09-04 DIAGNOSIS — Z471 Aftercare following joint replacement surgery: Secondary | ICD-10-CM | POA: Diagnosis not present

## 2019-09-08 DIAGNOSIS — E039 Hypothyroidism, unspecified: Secondary | ICD-10-CM | POA: Diagnosis not present

## 2019-09-11 DIAGNOSIS — Z471 Aftercare following joint replacement surgery: Secondary | ICD-10-CM | POA: Diagnosis not present

## 2019-09-11 DIAGNOSIS — Z96641 Presence of right artificial hip joint: Secondary | ICD-10-CM | POA: Diagnosis not present

## 2019-09-15 DIAGNOSIS — Z6823 Body mass index (BMI) 23.0-23.9, adult: Secondary | ICD-10-CM | POA: Diagnosis not present

## 2019-09-15 DIAGNOSIS — Z87891 Personal history of nicotine dependence: Secondary | ICD-10-CM | POA: Diagnosis not present

## 2019-09-15 DIAGNOSIS — Z471 Aftercare following joint replacement surgery: Secondary | ICD-10-CM | POA: Diagnosis not present

## 2019-09-15 DIAGNOSIS — F419 Anxiety disorder, unspecified: Secondary | ICD-10-CM | POA: Diagnosis not present

## 2019-09-15 DIAGNOSIS — Z23 Encounter for immunization: Secondary | ICD-10-CM | POA: Diagnosis not present

## 2019-09-15 DIAGNOSIS — K5790 Diverticulosis of intestine, part unspecified, without perforation or abscess without bleeding: Secondary | ICD-10-CM | POA: Diagnosis not present

## 2019-09-15 DIAGNOSIS — Z85828 Personal history of other malignant neoplasm of skin: Secondary | ICD-10-CM | POA: Diagnosis not present

## 2019-09-15 DIAGNOSIS — E039 Hypothyroidism, unspecified: Secondary | ICD-10-CM | POA: Diagnosis not present

## 2019-09-15 DIAGNOSIS — K648 Other hemorrhoids: Secondary | ICD-10-CM | POA: Diagnosis not present

## 2019-09-15 DIAGNOSIS — R7303 Prediabetes: Secondary | ICD-10-CM | POA: Diagnosis not present

## 2019-09-15 DIAGNOSIS — Z96641 Presence of right artificial hip joint: Secondary | ICD-10-CM | POA: Diagnosis not present

## 2019-09-17 DIAGNOSIS — M25551 Pain in right hip: Secondary | ICD-10-CM | POA: Diagnosis not present

## 2019-09-17 DIAGNOSIS — M6281 Muscle weakness (generalized): Secondary | ICD-10-CM | POA: Diagnosis not present

## 2019-09-17 DIAGNOSIS — M25651 Stiffness of right hip, not elsewhere classified: Secondary | ICD-10-CM | POA: Diagnosis not present

## 2019-09-22 DIAGNOSIS — M25651 Stiffness of right hip, not elsewhere classified: Secondary | ICD-10-CM | POA: Diagnosis not present

## 2019-09-22 DIAGNOSIS — M25551 Pain in right hip: Secondary | ICD-10-CM | POA: Diagnosis not present

## 2019-09-22 DIAGNOSIS — M6281 Muscle weakness (generalized): Secondary | ICD-10-CM | POA: Diagnosis not present

## 2019-09-24 DIAGNOSIS — M25651 Stiffness of right hip, not elsewhere classified: Secondary | ICD-10-CM | POA: Diagnosis not present

## 2019-09-24 DIAGNOSIS — M25551 Pain in right hip: Secondary | ICD-10-CM | POA: Diagnosis not present

## 2019-09-24 DIAGNOSIS — M6281 Muscle weakness (generalized): Secondary | ICD-10-CM | POA: Diagnosis not present

## 2019-09-25 DIAGNOSIS — Z96641 Presence of right artificial hip joint: Secondary | ICD-10-CM | POA: Diagnosis not present

## 2019-09-25 DIAGNOSIS — Z9889 Other specified postprocedural states: Secondary | ICD-10-CM | POA: Diagnosis not present

## 2019-09-29 DIAGNOSIS — M6281 Muscle weakness (generalized): Secondary | ICD-10-CM | POA: Diagnosis not present

## 2019-09-29 DIAGNOSIS — M25551 Pain in right hip: Secondary | ICD-10-CM | POA: Diagnosis not present

## 2019-09-29 DIAGNOSIS — M25651 Stiffness of right hip, not elsewhere classified: Secondary | ICD-10-CM | POA: Diagnosis not present

## 2019-10-01 DIAGNOSIS — M25551 Pain in right hip: Secondary | ICD-10-CM | POA: Diagnosis not present

## 2019-10-01 DIAGNOSIS — M6281 Muscle weakness (generalized): Secondary | ICD-10-CM | POA: Diagnosis not present

## 2019-10-01 DIAGNOSIS — M25651 Stiffness of right hip, not elsewhere classified: Secondary | ICD-10-CM | POA: Diagnosis not present

## 2019-10-02 DIAGNOSIS — L821 Other seborrheic keratosis: Secondary | ICD-10-CM | POA: Diagnosis not present

## 2019-10-02 DIAGNOSIS — D225 Melanocytic nevi of trunk: Secondary | ICD-10-CM | POA: Diagnosis not present

## 2019-10-02 DIAGNOSIS — L570 Actinic keratosis: Secondary | ICD-10-CM | POA: Diagnosis not present

## 2019-10-02 DIAGNOSIS — L578 Other skin changes due to chronic exposure to nonionizing radiation: Secondary | ICD-10-CM | POA: Diagnosis not present

## 2019-10-06 DIAGNOSIS — M25551 Pain in right hip: Secondary | ICD-10-CM | POA: Diagnosis not present

## 2019-10-06 DIAGNOSIS — M25651 Stiffness of right hip, not elsewhere classified: Secondary | ICD-10-CM | POA: Diagnosis not present

## 2019-10-06 DIAGNOSIS — M6281 Muscle weakness (generalized): Secondary | ICD-10-CM | POA: Diagnosis not present

## 2019-10-08 DIAGNOSIS — M25551 Pain in right hip: Secondary | ICD-10-CM | POA: Diagnosis not present

## 2019-10-08 DIAGNOSIS — M6281 Muscle weakness (generalized): Secondary | ICD-10-CM | POA: Diagnosis not present

## 2019-10-08 DIAGNOSIS — M25651 Stiffness of right hip, not elsewhere classified: Secondary | ICD-10-CM | POA: Diagnosis not present

## 2019-10-13 DIAGNOSIS — H2513 Age-related nuclear cataract, bilateral: Secondary | ICD-10-CM | POA: Diagnosis not present

## 2019-10-14 DIAGNOSIS — M6281 Muscle weakness (generalized): Secondary | ICD-10-CM | POA: Diagnosis not present

## 2019-10-14 DIAGNOSIS — M25651 Stiffness of right hip, not elsewhere classified: Secondary | ICD-10-CM | POA: Diagnosis not present

## 2019-10-14 DIAGNOSIS — M25551 Pain in right hip: Secondary | ICD-10-CM | POA: Diagnosis not present

## 2019-10-15 DIAGNOSIS — M25651 Stiffness of right hip, not elsewhere classified: Secondary | ICD-10-CM | POA: Diagnosis not present

## 2019-10-15 DIAGNOSIS — M25551 Pain in right hip: Secondary | ICD-10-CM | POA: Diagnosis not present

## 2019-10-15 DIAGNOSIS — M6281 Muscle weakness (generalized): Secondary | ICD-10-CM | POA: Diagnosis not present

## 2019-10-21 DIAGNOSIS — M6281 Muscle weakness (generalized): Secondary | ICD-10-CM | POA: Diagnosis not present

## 2019-10-21 DIAGNOSIS — M25651 Stiffness of right hip, not elsewhere classified: Secondary | ICD-10-CM | POA: Diagnosis not present

## 2019-10-21 DIAGNOSIS — M25551 Pain in right hip: Secondary | ICD-10-CM | POA: Diagnosis not present

## 2019-10-23 DIAGNOSIS — M25551 Pain in right hip: Secondary | ICD-10-CM | POA: Diagnosis not present

## 2019-10-23 DIAGNOSIS — M25651 Stiffness of right hip, not elsewhere classified: Secondary | ICD-10-CM | POA: Diagnosis not present

## 2019-10-23 DIAGNOSIS — M6281 Muscle weakness (generalized): Secondary | ICD-10-CM | POA: Diagnosis not present

## 2019-10-27 DIAGNOSIS — M6281 Muscle weakness (generalized): Secondary | ICD-10-CM | POA: Diagnosis not present

## 2019-10-27 DIAGNOSIS — R7303 Prediabetes: Secondary | ICD-10-CM | POA: Diagnosis not present

## 2019-10-27 DIAGNOSIS — M25651 Stiffness of right hip, not elsewhere classified: Secondary | ICD-10-CM | POA: Diagnosis not present

## 2019-10-27 DIAGNOSIS — M25551 Pain in right hip: Secondary | ICD-10-CM | POA: Diagnosis not present

## 2019-10-27 DIAGNOSIS — E039 Hypothyroidism, unspecified: Secondary | ICD-10-CM | POA: Diagnosis not present

## 2019-10-29 DIAGNOSIS — M25551 Pain in right hip: Secondary | ICD-10-CM | POA: Diagnosis not present

## 2019-10-29 DIAGNOSIS — M6281 Muscle weakness (generalized): Secondary | ICD-10-CM | POA: Diagnosis not present

## 2019-10-29 DIAGNOSIS — M25651 Stiffness of right hip, not elsewhere classified: Secondary | ICD-10-CM | POA: Diagnosis not present

## 2019-11-02 DIAGNOSIS — Z96641 Presence of right artificial hip joint: Secondary | ICD-10-CM | POA: Diagnosis not present

## 2019-11-02 DIAGNOSIS — Z471 Aftercare following joint replacement surgery: Secondary | ICD-10-CM | POA: Diagnosis not present

## 2019-11-03 DIAGNOSIS — E039 Hypothyroidism, unspecified: Secondary | ICD-10-CM | POA: Diagnosis not present

## 2019-11-03 DIAGNOSIS — Z6823 Body mass index (BMI) 23.0-23.9, adult: Secondary | ICD-10-CM | POA: Diagnosis not present

## 2019-11-03 DIAGNOSIS — R7303 Prediabetes: Secondary | ICD-10-CM | POA: Diagnosis not present

## 2019-12-02 DIAGNOSIS — Z96641 Presence of right artificial hip joint: Secondary | ICD-10-CM | POA: Diagnosis not present

## 2020-01-26 DIAGNOSIS — E039 Hypothyroidism, unspecified: Secondary | ICD-10-CM | POA: Diagnosis not present

## 2020-02-02 DIAGNOSIS — Z0000 Encounter for general adult medical examination without abnormal findings: Secondary | ICD-10-CM | POA: Diagnosis not present

## 2020-02-02 DIAGNOSIS — Z1389 Encounter for screening for other disorder: Secondary | ICD-10-CM | POA: Diagnosis not present

## 2020-02-02 DIAGNOSIS — Z1331 Encounter for screening for depression: Secondary | ICD-10-CM | POA: Diagnosis not present

## 2020-02-02 DIAGNOSIS — Z1339 Encounter for screening examination for other mental health and behavioral disorders: Secondary | ICD-10-CM | POA: Diagnosis not present

## 2020-02-02 DIAGNOSIS — Z139 Encounter for screening, unspecified: Secondary | ICD-10-CM | POA: Diagnosis not present

## 2020-02-02 DIAGNOSIS — H9313 Tinnitus, bilateral: Secondary | ICD-10-CM | POA: Diagnosis not present

## 2020-02-02 DIAGNOSIS — E039 Hypothyroidism, unspecified: Secondary | ICD-10-CM | POA: Diagnosis not present

## 2020-02-02 DIAGNOSIS — Z23 Encounter for immunization: Secondary | ICD-10-CM | POA: Diagnosis not present

## 2020-02-02 DIAGNOSIS — Z136 Encounter for screening for cardiovascular disorders: Secondary | ICD-10-CM | POA: Diagnosis not present

## 2020-05-25 DIAGNOSIS — Z13220 Encounter for screening for lipoid disorders: Secondary | ICD-10-CM | POA: Diagnosis not present

## 2020-05-25 DIAGNOSIS — E038 Other specified hypothyroidism: Secondary | ICD-10-CM | POA: Diagnosis not present

## 2020-05-25 DIAGNOSIS — R7303 Prediabetes: Secondary | ICD-10-CM | POA: Diagnosis not present

## 2020-05-31 DIAGNOSIS — E785 Hyperlipidemia, unspecified: Secondary | ICD-10-CM | POA: Diagnosis not present

## 2020-05-31 DIAGNOSIS — R7303 Prediabetes: Secondary | ICD-10-CM | POA: Diagnosis not present

## 2020-05-31 DIAGNOSIS — Z6824 Body mass index (BMI) 24.0-24.9, adult: Secondary | ICD-10-CM | POA: Diagnosis not present

## 2020-05-31 DIAGNOSIS — E038 Other specified hypothyroidism: Secondary | ICD-10-CM | POA: Diagnosis not present

## 2020-09-13 DIAGNOSIS — D225 Melanocytic nevi of trunk: Secondary | ICD-10-CM | POA: Diagnosis not present

## 2020-09-13 DIAGNOSIS — L918 Other hypertrophic disorders of the skin: Secondary | ICD-10-CM | POA: Diagnosis not present

## 2020-09-13 DIAGNOSIS — D2239 Melanocytic nevi of other parts of face: Secondary | ICD-10-CM | POA: Diagnosis not present

## 2020-09-13 DIAGNOSIS — D485 Neoplasm of uncertain behavior of skin: Secondary | ICD-10-CM | POA: Diagnosis not present

## 2020-09-13 DIAGNOSIS — L570 Actinic keratosis: Secondary | ICD-10-CM | POA: Diagnosis not present

## 2020-09-13 DIAGNOSIS — D1801 Hemangioma of skin and subcutaneous tissue: Secondary | ICD-10-CM | POA: Diagnosis not present

## 2020-09-15 DIAGNOSIS — M720 Palmar fascial fibromatosis [Dupuytren]: Secondary | ICD-10-CM | POA: Diagnosis not present

## 2020-09-15 DIAGNOSIS — M180 Bilateral primary osteoarthritis of first carpometacarpal joints: Secondary | ICD-10-CM | POA: Diagnosis not present

## 2020-09-28 DIAGNOSIS — C44729 Squamous cell carcinoma of skin of left lower limb, including hip: Secondary | ICD-10-CM | POA: Diagnosis not present

## 2020-10-19 DIAGNOSIS — H2513 Age-related nuclear cataract, bilateral: Secondary | ICD-10-CM | POA: Diagnosis not present

## 2020-11-01 DIAGNOSIS — Z6824 Body mass index (BMI) 24.0-24.9, adult: Secondary | ICD-10-CM | POA: Diagnosis not present

## 2020-11-01 DIAGNOSIS — K649 Unspecified hemorrhoids: Secondary | ICD-10-CM | POA: Diagnosis not present

## 2020-11-01 DIAGNOSIS — K602 Anal fissure, unspecified: Secondary | ICD-10-CM | POA: Diagnosis not present

## 2020-11-22 DIAGNOSIS — R7303 Prediabetes: Secondary | ICD-10-CM | POA: Diagnosis not present

## 2020-11-22 DIAGNOSIS — E785 Hyperlipidemia, unspecified: Secondary | ICD-10-CM | POA: Diagnosis not present

## 2020-11-22 DIAGNOSIS — E038 Other specified hypothyroidism: Secondary | ICD-10-CM | POA: Diagnosis not present

## 2020-11-29 DIAGNOSIS — E038 Other specified hypothyroidism: Secondary | ICD-10-CM | POA: Diagnosis not present

## 2020-11-29 DIAGNOSIS — Z1339 Encounter for screening examination for other mental health and behavioral disorders: Secondary | ICD-10-CM | POA: Diagnosis not present

## 2020-11-29 DIAGNOSIS — R7303 Prediabetes: Secondary | ICD-10-CM | POA: Diagnosis not present

## 2020-11-29 DIAGNOSIS — Z139 Encounter for screening, unspecified: Secondary | ICD-10-CM | POA: Diagnosis not present

## 2020-11-29 DIAGNOSIS — Z6824 Body mass index (BMI) 24.0-24.9, adult: Secondary | ICD-10-CM | POA: Diagnosis not present

## 2020-11-29 DIAGNOSIS — E785 Hyperlipidemia, unspecified: Secondary | ICD-10-CM | POA: Diagnosis not present

## 2021-01-20 DIAGNOSIS — C44729 Squamous cell carcinoma of skin of left lower limb, including hip: Secondary | ICD-10-CM | POA: Diagnosis not present

## 2021-04-14 DIAGNOSIS — D225 Melanocytic nevi of trunk: Secondary | ICD-10-CM | POA: Diagnosis not present

## 2021-04-14 DIAGNOSIS — L209 Atopic dermatitis, unspecified: Secondary | ICD-10-CM | POA: Diagnosis not present

## 2021-04-14 DIAGNOSIS — D2239 Melanocytic nevi of other parts of face: Secondary | ICD-10-CM | POA: Diagnosis not present

## 2021-04-14 DIAGNOSIS — L821 Other seborrheic keratosis: Secondary | ICD-10-CM | POA: Diagnosis not present

## 2021-04-14 DIAGNOSIS — L570 Actinic keratosis: Secondary | ICD-10-CM | POA: Diagnosis not present

## 2021-04-14 DIAGNOSIS — L299 Pruritus, unspecified: Secondary | ICD-10-CM | POA: Diagnosis not present

## 2021-05-25 DIAGNOSIS — E785 Hyperlipidemia, unspecified: Secondary | ICD-10-CM | POA: Diagnosis not present

## 2021-05-25 DIAGNOSIS — E038 Other specified hypothyroidism: Secondary | ICD-10-CM | POA: Diagnosis not present

## 2021-05-25 DIAGNOSIS — R7303 Prediabetes: Secondary | ICD-10-CM | POA: Diagnosis not present

## 2021-05-25 DIAGNOSIS — Z125 Encounter for screening for malignant neoplasm of prostate: Secondary | ICD-10-CM | POA: Diagnosis not present

## 2021-05-31 DIAGNOSIS — Z1339 Encounter for screening examination for other mental health and behavioral disorders: Secondary | ICD-10-CM | POA: Diagnosis not present

## 2021-05-31 DIAGNOSIS — E038 Other specified hypothyroidism: Secondary | ICD-10-CM | POA: Diagnosis not present

## 2021-05-31 DIAGNOSIS — Z136 Encounter for screening for cardiovascular disorders: Secondary | ICD-10-CM | POA: Diagnosis not present

## 2021-05-31 DIAGNOSIS — R7303 Prediabetes: Secondary | ICD-10-CM | POA: Diagnosis not present

## 2021-05-31 DIAGNOSIS — Z1331 Encounter for screening for depression: Secondary | ICD-10-CM | POA: Diagnosis not present

## 2021-05-31 DIAGNOSIS — Z139 Encounter for screening, unspecified: Secondary | ICD-10-CM | POA: Diagnosis not present

## 2021-05-31 DIAGNOSIS — E785 Hyperlipidemia, unspecified: Secondary | ICD-10-CM | POA: Diagnosis not present

## 2021-05-31 DIAGNOSIS — Z0000 Encounter for general adult medical examination without abnormal findings: Secondary | ICD-10-CM | POA: Diagnosis not present

## 2021-05-31 DIAGNOSIS — Z7189 Other specified counseling: Secondary | ICD-10-CM | POA: Diagnosis not present

## 2021-09-15 DIAGNOSIS — L570 Actinic keratosis: Secondary | ICD-10-CM | POA: Diagnosis not present

## 2021-09-15 DIAGNOSIS — D225 Melanocytic nevi of trunk: Secondary | ICD-10-CM | POA: Diagnosis not present

## 2021-09-15 DIAGNOSIS — D485 Neoplasm of uncertain behavior of skin: Secondary | ICD-10-CM | POA: Diagnosis not present

## 2021-09-15 DIAGNOSIS — L820 Inflamed seborrheic keratosis: Secondary | ICD-10-CM | POA: Diagnosis not present

## 2021-09-15 DIAGNOSIS — D2239 Melanocytic nevi of other parts of face: Secondary | ICD-10-CM | POA: Diagnosis not present

## 2021-09-15 DIAGNOSIS — L821 Other seborrheic keratosis: Secondary | ICD-10-CM | POA: Diagnosis not present

## 2021-11-01 DIAGNOSIS — H2513 Age-related nuclear cataract, bilateral: Secondary | ICD-10-CM | POA: Diagnosis not present

## 2021-11-01 DIAGNOSIS — H04123 Dry eye syndrome of bilateral lacrimal glands: Secondary | ICD-10-CM | POA: Diagnosis not present

## 2021-11-29 DIAGNOSIS — E785 Hyperlipidemia, unspecified: Secondary | ICD-10-CM | POA: Diagnosis not present

## 2021-11-29 DIAGNOSIS — R7303 Prediabetes: Secondary | ICD-10-CM | POA: Diagnosis not present

## 2021-11-29 DIAGNOSIS — E038 Other specified hypothyroidism: Secondary | ICD-10-CM | POA: Diagnosis not present

## 2021-12-06 DIAGNOSIS — Z6824 Body mass index (BMI) 24.0-24.9, adult: Secondary | ICD-10-CM | POA: Diagnosis not present

## 2021-12-06 DIAGNOSIS — Z1331 Encounter for screening for depression: Secondary | ICD-10-CM | POA: Diagnosis not present

## 2021-12-06 DIAGNOSIS — Z136 Encounter for screening for cardiovascular disorders: Secondary | ICD-10-CM | POA: Diagnosis not present

## 2021-12-06 DIAGNOSIS — R7303 Prediabetes: Secondary | ICD-10-CM | POA: Diagnosis not present

## 2021-12-06 DIAGNOSIS — E038 Other specified hypothyroidism: Secondary | ICD-10-CM | POA: Diagnosis not present

## 2021-12-06 DIAGNOSIS — E785 Hyperlipidemia, unspecified: Secondary | ICD-10-CM | POA: Diagnosis not present

## 2021-12-08 DIAGNOSIS — Z136 Encounter for screening for cardiovascular disorders: Secondary | ICD-10-CM | POA: Diagnosis not present

## 2021-12-08 DIAGNOSIS — Z87891 Personal history of nicotine dependence: Secondary | ICD-10-CM | POA: Diagnosis not present

## 2021-12-08 DIAGNOSIS — F17210 Nicotine dependence, cigarettes, uncomplicated: Secondary | ICD-10-CM | POA: Diagnosis not present

## 2021-12-08 DIAGNOSIS — I700 Atherosclerosis of aorta: Secondary | ICD-10-CM | POA: Diagnosis not present

## 2021-12-18 DIAGNOSIS — I700 Atherosclerosis of aorta: Secondary | ICD-10-CM | POA: Diagnosis not present

## 2021-12-18 DIAGNOSIS — Z6824 Body mass index (BMI) 24.0-24.9, adult: Secondary | ICD-10-CM | POA: Diagnosis not present

## 2021-12-18 DIAGNOSIS — E038 Other specified hypothyroidism: Secondary | ICD-10-CM | POA: Diagnosis not present

## 2022-01-30 DIAGNOSIS — E038 Other specified hypothyroidism: Secondary | ICD-10-CM | POA: Diagnosis not present

## 2022-02-10 IMAGING — DX DG CHEST 2V
2 series · 2 of 2 positions shown · non-contrast
Comparison: None.

CLINICAL DATA: Preop for left hip surgery.

EXAM:
CHEST - 2 VIEW

[chest pa]
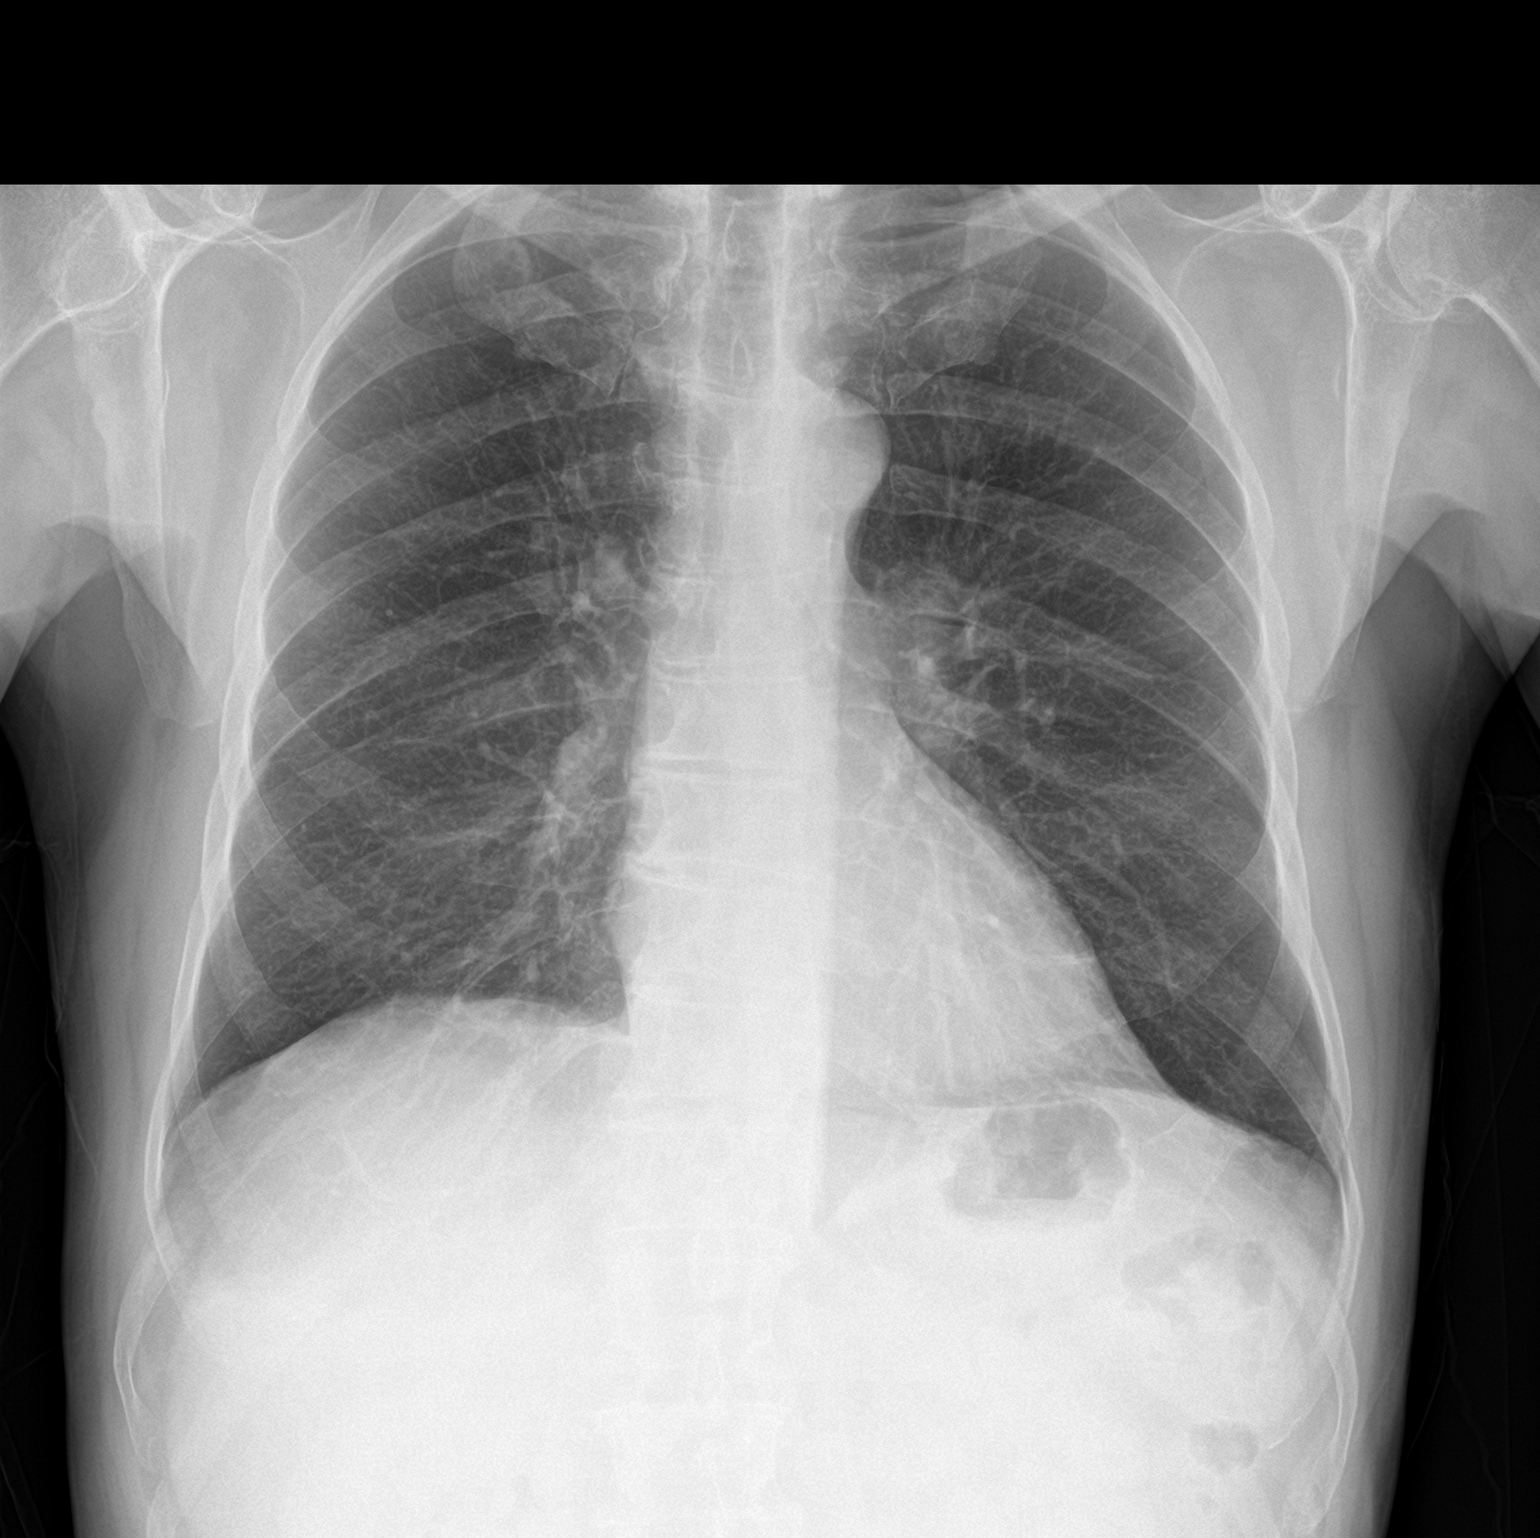

[chest lat]
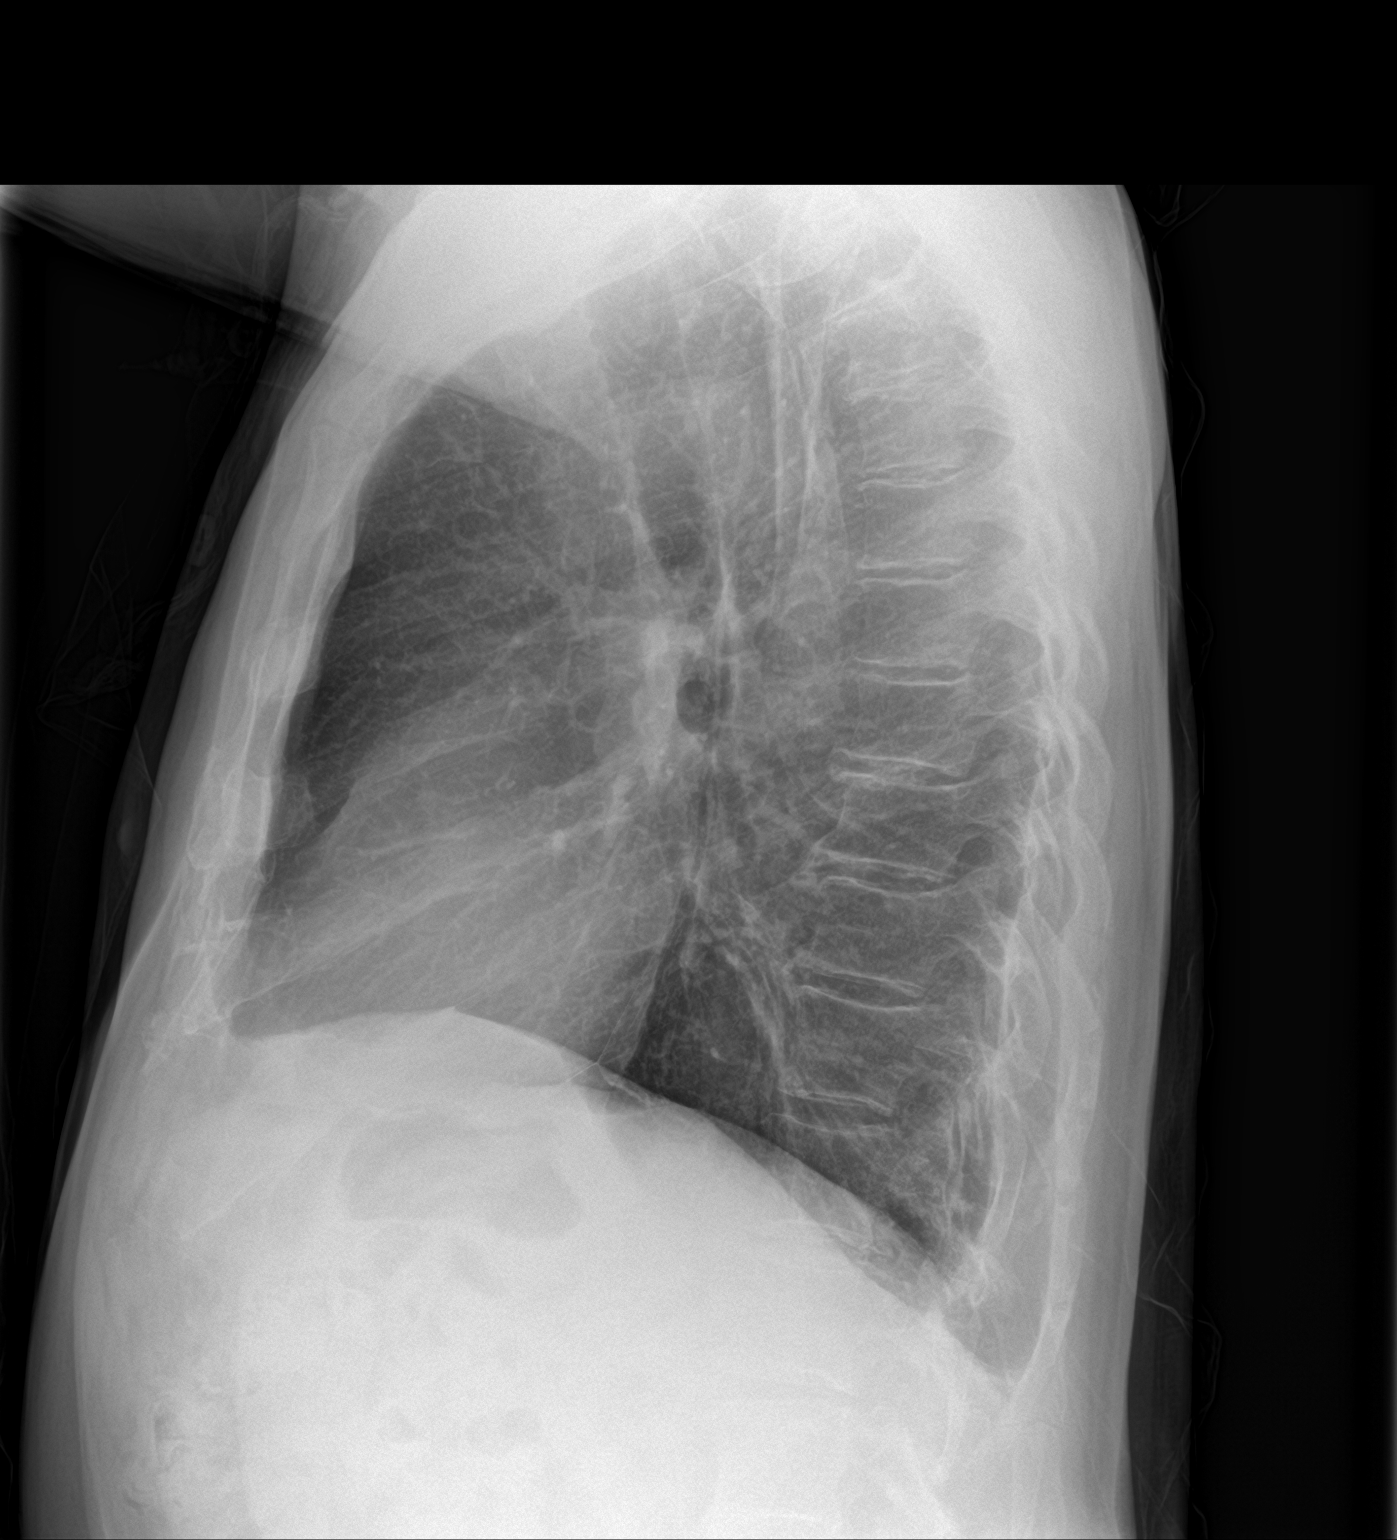

[2 of 2 positions shown; findings below may reference images not displayed]

FINDINGS: The heart size and mediastinal contours are within normal limits.
Both lungs are clear. The visualized skeletal structures are
unremarkable.
IMPRESSION: No active cardiopulmonary disease.

## 2022-02-21 IMAGING — RF DG HIP (WITH PELVIS) OPERATIVE*R*
1 series · 3 of 3 positions shown · non-contrast
Comparison: None

FLUOROSCOPY TIME:  0 minutes 34 seconds

CLINICAL DATA: RIGHT anterior hip replacement

EXAM:
OPERATIVE RIGHT HIP (WITH PELVIS IF PERFORMED) 3 VIEWS
TECHNIQUE: Fluoroscopic spot image(s) were submitted for interpretation
post-operatively.

[Series 1: unknown protocol · 0.20mm/px · 3 of 3 slices shown]
[im 1/3]
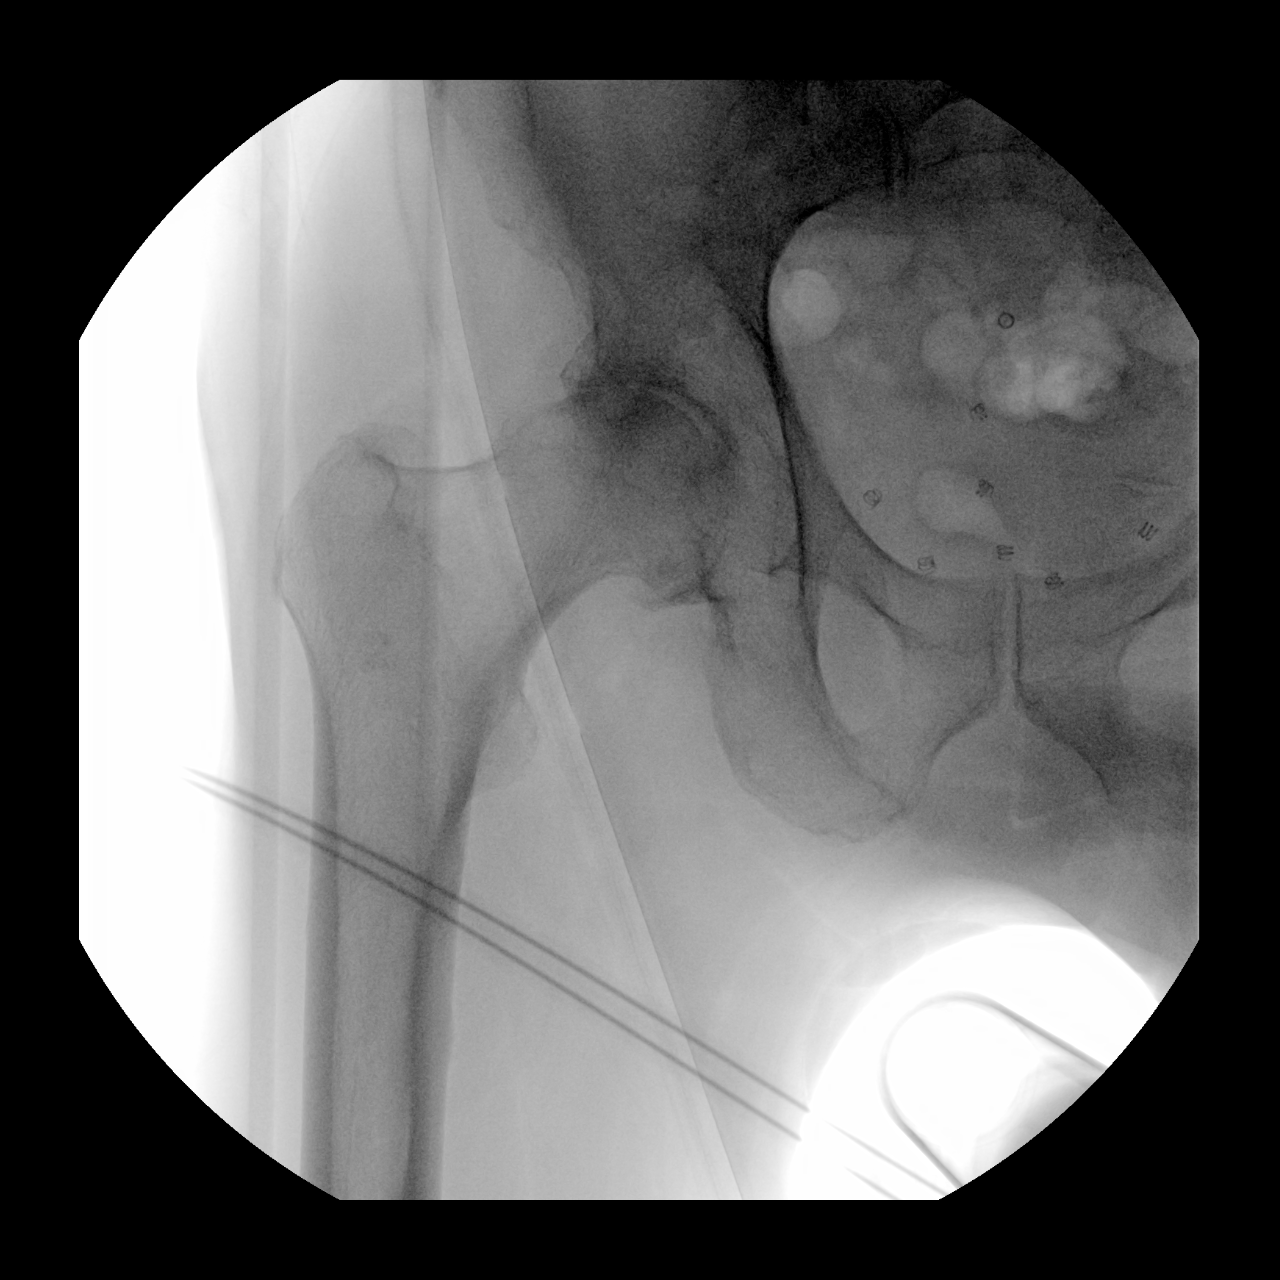
[im 2/3]
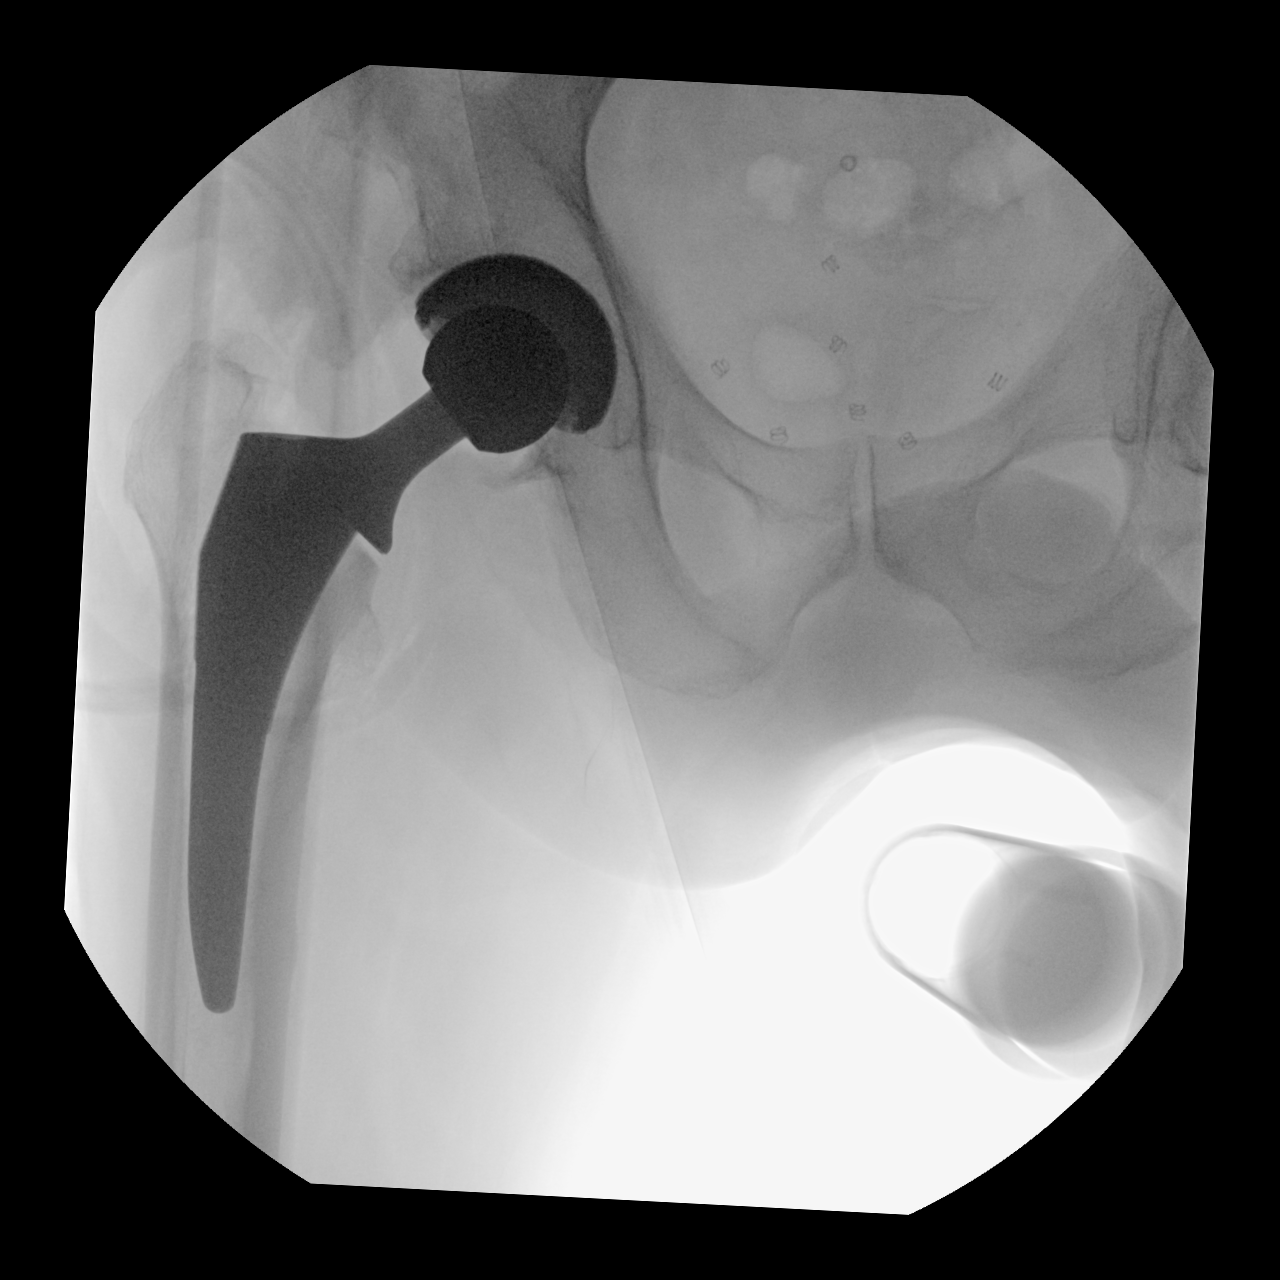
[im 3/3]
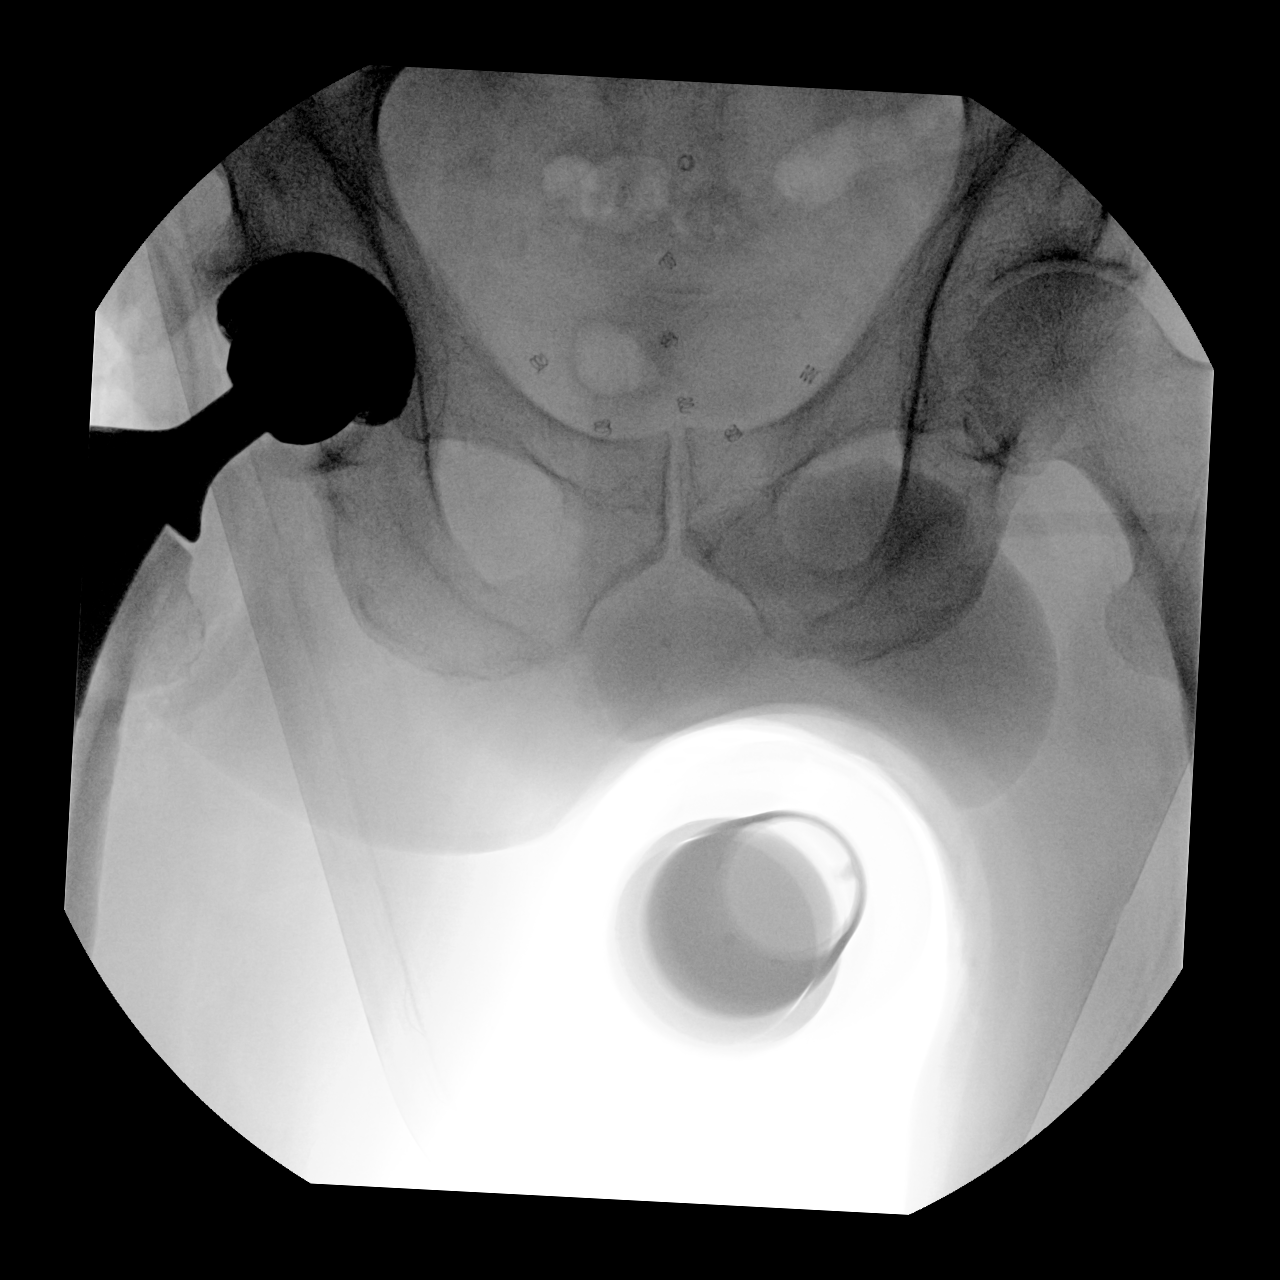

[3 of 3 positions shown; findings below may reference images not displayed]

FINDINGS: Osseous demineralization.

Advanced osteoarthritic changes of RIGHT hip joint.

Subsequent images demonstrate a RIGHT hip prosthesis without acute
fracture or dislocation.

Mild narrowing of LEFT hip joint as well.
IMPRESSION: RIGHT hip replacement for osteoarthritis.

No acute abnormalities.

## 2022-03-23 DIAGNOSIS — L820 Inflamed seborrheic keratosis: Secondary | ICD-10-CM | POA: Diagnosis not present

## 2022-03-23 DIAGNOSIS — L570 Actinic keratosis: Secondary | ICD-10-CM | POA: Diagnosis not present

## 2022-03-23 DIAGNOSIS — D225 Melanocytic nevi of trunk: Secondary | ICD-10-CM | POA: Diagnosis not present

## 2022-03-23 DIAGNOSIS — D485 Neoplasm of uncertain behavior of skin: Secondary | ICD-10-CM | POA: Diagnosis not present

## 2022-03-23 DIAGNOSIS — D2239 Melanocytic nevi of other parts of face: Secondary | ICD-10-CM | POA: Diagnosis not present

## 2022-03-30 DIAGNOSIS — C44319 Basal cell carcinoma of skin of other parts of face: Secondary | ICD-10-CM | POA: Diagnosis not present

## 2022-05-23 DIAGNOSIS — Z125 Encounter for screening for malignant neoplasm of prostate: Secondary | ICD-10-CM | POA: Diagnosis not present

## 2022-05-23 DIAGNOSIS — E038 Other specified hypothyroidism: Secondary | ICD-10-CM | POA: Diagnosis not present

## 2022-05-23 DIAGNOSIS — R7303 Prediabetes: Secondary | ICD-10-CM | POA: Diagnosis not present

## 2022-05-23 DIAGNOSIS — E785 Hyperlipidemia, unspecified: Secondary | ICD-10-CM | POA: Diagnosis not present

## 2022-05-31 DIAGNOSIS — I700 Atherosclerosis of aorta: Secondary | ICD-10-CM | POA: Diagnosis not present

## 2022-05-31 DIAGNOSIS — R7303 Prediabetes: Secondary | ICD-10-CM | POA: Diagnosis not present

## 2022-05-31 DIAGNOSIS — R350 Frequency of micturition: Secondary | ICD-10-CM | POA: Diagnosis not present

## 2022-05-31 DIAGNOSIS — Z139 Encounter for screening, unspecified: Secondary | ICD-10-CM | POA: Diagnosis not present

## 2022-05-31 DIAGNOSIS — E038 Other specified hypothyroidism: Secondary | ICD-10-CM | POA: Diagnosis not present

## 2022-05-31 DIAGNOSIS — Z6824 Body mass index (BMI) 24.0-24.9, adult: Secondary | ICD-10-CM | POA: Diagnosis not present

## 2022-07-16 DIAGNOSIS — M25512 Pain in left shoulder: Secondary | ICD-10-CM | POA: Diagnosis not present

## 2022-07-20 DIAGNOSIS — D2239 Melanocytic nevi of other parts of face: Secondary | ICD-10-CM | POA: Diagnosis not present

## 2022-07-20 DIAGNOSIS — D225 Melanocytic nevi of trunk: Secondary | ICD-10-CM | POA: Diagnosis not present

## 2022-07-20 DIAGNOSIS — C44319 Basal cell carcinoma of skin of other parts of face: Secondary | ICD-10-CM | POA: Diagnosis not present

## 2022-07-20 DIAGNOSIS — L821 Other seborrheic keratosis: Secondary | ICD-10-CM | POA: Diagnosis not present

## 2022-07-20 DIAGNOSIS — L570 Actinic keratosis: Secondary | ICD-10-CM | POA: Diagnosis not present

## 2022-08-24 DIAGNOSIS — M19012 Primary osteoarthritis, left shoulder: Secondary | ICD-10-CM | POA: Diagnosis not present

## 2022-11-21 DIAGNOSIS — H2513 Age-related nuclear cataract, bilateral: Secondary | ICD-10-CM | POA: Diagnosis not present

## 2022-11-22 DIAGNOSIS — R7303 Prediabetes: Secondary | ICD-10-CM | POA: Diagnosis not present

## 2022-11-22 DIAGNOSIS — E785 Hyperlipidemia, unspecified: Secondary | ICD-10-CM | POA: Diagnosis not present

## 2022-11-29 DIAGNOSIS — Z6824 Body mass index (BMI) 24.0-24.9, adult: Secondary | ICD-10-CM | POA: Diagnosis not present

## 2022-11-29 DIAGNOSIS — E669 Obesity, unspecified: Secondary | ICD-10-CM | POA: Diagnosis not present

## 2022-11-29 DIAGNOSIS — E785 Hyperlipidemia, unspecified: Secondary | ICD-10-CM | POA: Diagnosis not present

## 2022-11-29 DIAGNOSIS — Z139 Encounter for screening, unspecified: Secondary | ICD-10-CM | POA: Diagnosis not present

## 2022-11-29 DIAGNOSIS — E038 Other specified hypothyroidism: Secondary | ICD-10-CM | POA: Diagnosis not present

## 2022-11-29 DIAGNOSIS — Z136 Encounter for screening for cardiovascular disorders: Secondary | ICD-10-CM | POA: Diagnosis not present

## 2022-11-29 DIAGNOSIS — Z1331 Encounter for screening for depression: Secondary | ICD-10-CM | POA: Diagnosis not present

## 2022-11-29 DIAGNOSIS — R7303 Prediabetes: Secondary | ICD-10-CM | POA: Diagnosis not present

## 2022-11-29 DIAGNOSIS — Z1339 Encounter for screening examination for other mental health and behavioral disorders: Secondary | ICD-10-CM | POA: Diagnosis not present

## 2022-11-29 DIAGNOSIS — Z0000 Encounter for general adult medical examination without abnormal findings: Secondary | ICD-10-CM | POA: Diagnosis not present

## 2022-12-03 DIAGNOSIS — M19012 Primary osteoarthritis, left shoulder: Secondary | ICD-10-CM | POA: Diagnosis not present

## 2023-01-25 DIAGNOSIS — D225 Melanocytic nevi of trunk: Secondary | ICD-10-CM | POA: Diagnosis not present

## 2023-01-25 DIAGNOSIS — D2239 Melanocytic nevi of other parts of face: Secondary | ICD-10-CM | POA: Diagnosis not present

## 2023-01-25 DIAGNOSIS — L570 Actinic keratosis: Secondary | ICD-10-CM | POA: Diagnosis not present

## 2023-01-25 DIAGNOSIS — R233 Spontaneous ecchymoses: Secondary | ICD-10-CM | POA: Diagnosis not present

## 2023-01-25 DIAGNOSIS — L820 Inflamed seborrheic keratosis: Secondary | ICD-10-CM | POA: Diagnosis not present

## 2023-02-27 DIAGNOSIS — M19012 Primary osteoarthritis, left shoulder: Secondary | ICD-10-CM | POA: Diagnosis not present

## 2023-03-14 DIAGNOSIS — M25712 Osteophyte, left shoulder: Secondary | ICD-10-CM | POA: Diagnosis not present

## 2023-03-14 DIAGNOSIS — M948X1 Other specified disorders of cartilage, shoulder: Secondary | ICD-10-CM | POA: Diagnosis not present

## 2023-03-14 DIAGNOSIS — M7582 Other shoulder lesions, left shoulder: Secondary | ICD-10-CM | POA: Diagnosis not present

## 2023-03-14 DIAGNOSIS — M19012 Primary osteoarthritis, left shoulder: Secondary | ICD-10-CM | POA: Diagnosis not present

## 2023-03-20 DIAGNOSIS — M19012 Primary osteoarthritis, left shoulder: Secondary | ICD-10-CM | POA: Diagnosis not present

## 2023-04-03 DIAGNOSIS — Z6824 Body mass index (BMI) 24.0-24.9, adult: Secondary | ICD-10-CM | POA: Diagnosis not present

## 2023-04-03 DIAGNOSIS — Z01818 Encounter for other preprocedural examination: Secondary | ICD-10-CM | POA: Diagnosis not present

## 2023-04-03 DIAGNOSIS — Z1331 Encounter for screening for depression: Secondary | ICD-10-CM | POA: Diagnosis not present

## 2023-05-10 DIAGNOSIS — M19012 Primary osteoarthritis, left shoulder: Secondary | ICD-10-CM | POA: Diagnosis not present

## 2023-05-13 DIAGNOSIS — M19012 Primary osteoarthritis, left shoulder: Secondary | ICD-10-CM | POA: Diagnosis not present

## 2023-05-13 DIAGNOSIS — M25612 Stiffness of left shoulder, not elsewhere classified: Secondary | ICD-10-CM | POA: Diagnosis not present

## 2023-05-13 DIAGNOSIS — M25512 Pain in left shoulder: Secondary | ICD-10-CM | POA: Diagnosis not present

## 2023-05-21 DIAGNOSIS — G8918 Other acute postprocedural pain: Secondary | ICD-10-CM | POA: Diagnosis not present

## 2023-05-21 DIAGNOSIS — Z96612 Presence of left artificial shoulder joint: Secondary | ICD-10-CM | POA: Diagnosis not present

## 2023-05-21 DIAGNOSIS — M19012 Primary osteoarthritis, left shoulder: Secondary | ICD-10-CM | POA: Diagnosis not present

## 2023-05-21 DIAGNOSIS — M25712 Osteophyte, left shoulder: Secondary | ICD-10-CM | POA: Diagnosis not present

## 2023-06-07 DIAGNOSIS — M19012 Primary osteoarthritis, left shoulder: Secondary | ICD-10-CM | POA: Diagnosis not present

## 2023-07-08 DIAGNOSIS — M19012 Primary osteoarthritis, left shoulder: Secondary | ICD-10-CM | POA: Diagnosis not present

## 2023-07-09 DIAGNOSIS — M25512 Pain in left shoulder: Secondary | ICD-10-CM | POA: Diagnosis not present

## 2023-07-09 DIAGNOSIS — M6281 Muscle weakness (generalized): Secondary | ICD-10-CM | POA: Diagnosis not present

## 2023-07-11 DIAGNOSIS — R7303 Prediabetes: Secondary | ICD-10-CM | POA: Diagnosis not present

## 2023-07-11 DIAGNOSIS — E785 Hyperlipidemia, unspecified: Secondary | ICD-10-CM | POA: Diagnosis not present

## 2023-07-11 DIAGNOSIS — Z125 Encounter for screening for malignant neoplasm of prostate: Secondary | ICD-10-CM | POA: Diagnosis not present

## 2023-07-11 DIAGNOSIS — E038 Other specified hypothyroidism: Secondary | ICD-10-CM | POA: Diagnosis not present

## 2023-07-12 DIAGNOSIS — M25512 Pain in left shoulder: Secondary | ICD-10-CM | POA: Diagnosis not present

## 2023-07-12 DIAGNOSIS — M6281 Muscle weakness (generalized): Secondary | ICD-10-CM | POA: Diagnosis not present

## 2023-07-15 DIAGNOSIS — M25512 Pain in left shoulder: Secondary | ICD-10-CM | POA: Diagnosis not present

## 2023-07-15 DIAGNOSIS — M6281 Muscle weakness (generalized): Secondary | ICD-10-CM | POA: Diagnosis not present

## 2023-07-17 DIAGNOSIS — E038 Other specified hypothyroidism: Secondary | ICD-10-CM | POA: Diagnosis not present

## 2023-07-17 DIAGNOSIS — M25512 Pain in left shoulder: Secondary | ICD-10-CM | POA: Diagnosis not present

## 2023-07-17 DIAGNOSIS — Z6825 Body mass index (BMI) 25.0-25.9, adult: Secondary | ICD-10-CM | POA: Diagnosis not present

## 2023-07-17 DIAGNOSIS — R7303 Prediabetes: Secondary | ICD-10-CM | POA: Diagnosis not present

## 2023-07-17 DIAGNOSIS — E785 Hyperlipidemia, unspecified: Secondary | ICD-10-CM | POA: Diagnosis not present

## 2023-07-17 DIAGNOSIS — M6281 Muscle weakness (generalized): Secondary | ICD-10-CM | POA: Diagnosis not present

## 2023-07-17 DIAGNOSIS — I700 Atherosclerosis of aorta: Secondary | ICD-10-CM | POA: Diagnosis not present

## 2023-07-22 DIAGNOSIS — M6281 Muscle weakness (generalized): Secondary | ICD-10-CM | POA: Diagnosis not present

## 2023-07-22 DIAGNOSIS — M25512 Pain in left shoulder: Secondary | ICD-10-CM | POA: Diagnosis not present

## 2023-07-24 DIAGNOSIS — M6281 Muscle weakness (generalized): Secondary | ICD-10-CM | POA: Diagnosis not present

## 2023-07-24 DIAGNOSIS — M25512 Pain in left shoulder: Secondary | ICD-10-CM | POA: Diagnosis not present

## 2023-07-29 DIAGNOSIS — M25512 Pain in left shoulder: Secondary | ICD-10-CM | POA: Diagnosis not present

## 2023-07-29 DIAGNOSIS — M6281 Muscle weakness (generalized): Secondary | ICD-10-CM | POA: Diagnosis not present

## 2023-07-31 DIAGNOSIS — M6281 Muscle weakness (generalized): Secondary | ICD-10-CM | POA: Diagnosis not present

## 2023-07-31 DIAGNOSIS — M25512 Pain in left shoulder: Secondary | ICD-10-CM | POA: Diagnosis not present

## 2023-08-05 DIAGNOSIS — M25512 Pain in left shoulder: Secondary | ICD-10-CM | POA: Diagnosis not present

## 2023-08-05 DIAGNOSIS — M6281 Muscle weakness (generalized): Secondary | ICD-10-CM | POA: Diagnosis not present

## 2023-08-07 DIAGNOSIS — M6281 Muscle weakness (generalized): Secondary | ICD-10-CM | POA: Diagnosis not present

## 2023-08-07 DIAGNOSIS — M25512 Pain in left shoulder: Secondary | ICD-10-CM | POA: Diagnosis not present

## 2023-08-12 DIAGNOSIS — M25512 Pain in left shoulder: Secondary | ICD-10-CM | POA: Diagnosis not present

## 2023-08-12 DIAGNOSIS — M6281 Muscle weakness (generalized): Secondary | ICD-10-CM | POA: Diagnosis not present

## 2023-08-14 DIAGNOSIS — M25512 Pain in left shoulder: Secondary | ICD-10-CM | POA: Diagnosis not present

## 2023-08-14 DIAGNOSIS — M6281 Muscle weakness (generalized): Secondary | ICD-10-CM | POA: Diagnosis not present

## 2023-08-19 DIAGNOSIS — M19012 Primary osteoarthritis, left shoulder: Secondary | ICD-10-CM | POA: Diagnosis not present

## 2023-08-21 DIAGNOSIS — M25512 Pain in left shoulder: Secondary | ICD-10-CM | POA: Diagnosis not present

## 2023-08-21 DIAGNOSIS — M6281 Muscle weakness (generalized): Secondary | ICD-10-CM | POA: Diagnosis not present

## 2023-08-26 DIAGNOSIS — M25512 Pain in left shoulder: Secondary | ICD-10-CM | POA: Diagnosis not present

## 2023-08-26 DIAGNOSIS — M6281 Muscle weakness (generalized): Secondary | ICD-10-CM | POA: Diagnosis not present

## 2023-08-28 DIAGNOSIS — M25512 Pain in left shoulder: Secondary | ICD-10-CM | POA: Diagnosis not present

## 2023-08-28 DIAGNOSIS — M6281 Muscle weakness (generalized): Secondary | ICD-10-CM | POA: Diagnosis not present

## 2023-09-02 DIAGNOSIS — M6281 Muscle weakness (generalized): Secondary | ICD-10-CM | POA: Diagnosis not present

## 2023-09-02 DIAGNOSIS — M25512 Pain in left shoulder: Secondary | ICD-10-CM | POA: Diagnosis not present

## 2023-09-03 DIAGNOSIS — M720 Palmar fascial fibromatosis [Dupuytren]: Secondary | ICD-10-CM | POA: Diagnosis not present

## 2023-09-04 DIAGNOSIS — M25512 Pain in left shoulder: Secondary | ICD-10-CM | POA: Diagnosis not present

## 2023-09-04 DIAGNOSIS — M6281 Muscle weakness (generalized): Secondary | ICD-10-CM | POA: Diagnosis not present

## 2023-09-09 DIAGNOSIS — M6281 Muscle weakness (generalized): Secondary | ICD-10-CM | POA: Diagnosis not present

## 2023-09-09 DIAGNOSIS — M25512 Pain in left shoulder: Secondary | ICD-10-CM | POA: Diagnosis not present

## 2023-09-11 DIAGNOSIS — M6281 Muscle weakness (generalized): Secondary | ICD-10-CM | POA: Diagnosis not present

## 2023-09-11 DIAGNOSIS — M25512 Pain in left shoulder: Secondary | ICD-10-CM | POA: Diagnosis not present

## 2023-09-16 DIAGNOSIS — M6281 Muscle weakness (generalized): Secondary | ICD-10-CM | POA: Diagnosis not present

## 2023-09-16 DIAGNOSIS — M25512 Pain in left shoulder: Secondary | ICD-10-CM | POA: Diagnosis not present

## 2023-10-01 DIAGNOSIS — M6281 Muscle weakness (generalized): Secondary | ICD-10-CM | POA: Diagnosis not present

## 2023-10-01 DIAGNOSIS — M25512 Pain in left shoulder: Secondary | ICD-10-CM | POA: Diagnosis not present

## 2023-11-20 DIAGNOSIS — M19012 Primary osteoarthritis, left shoulder: Secondary | ICD-10-CM | POA: Diagnosis not present

## 2023-11-30 DIAGNOSIS — L0100 Impetigo, unspecified: Secondary | ICD-10-CM | POA: Diagnosis not present

## 2023-11-30 DIAGNOSIS — D485 Neoplasm of uncertain behavior of skin: Secondary | ICD-10-CM | POA: Diagnosis not present

## 2023-12-13 DIAGNOSIS — C4441 Basal cell carcinoma of skin of scalp and neck: Secondary | ICD-10-CM | POA: Diagnosis not present

## 2024-01-14 DIAGNOSIS — E785 Hyperlipidemia, unspecified: Secondary | ICD-10-CM | POA: Diagnosis not present

## 2024-01-14 DIAGNOSIS — E038 Other specified hypothyroidism: Secondary | ICD-10-CM | POA: Diagnosis not present

## 2024-01-14 DIAGNOSIS — R7303 Prediabetes: Secondary | ICD-10-CM | POA: Diagnosis not present

## 2024-01-18 DIAGNOSIS — L570 Actinic keratosis: Secondary | ICD-10-CM | POA: Diagnosis not present

## 2024-01-18 DIAGNOSIS — D485 Neoplasm of uncertain behavior of skin: Secondary | ICD-10-CM | POA: Diagnosis not present

## 2024-01-18 DIAGNOSIS — D2239 Melanocytic nevi of other parts of face: Secondary | ICD-10-CM | POA: Diagnosis not present

## 2024-01-18 DIAGNOSIS — L578 Other skin changes due to chronic exposure to nonionizing radiation: Secondary | ICD-10-CM | POA: Diagnosis not present

## 2024-01-18 DIAGNOSIS — L820 Inflamed seborrheic keratosis: Secondary | ICD-10-CM | POA: Diagnosis not present

## 2024-01-22 DIAGNOSIS — Z1389 Encounter for screening for other disorder: Secondary | ICD-10-CM | POA: Diagnosis not present

## 2024-01-22 DIAGNOSIS — R7303 Prediabetes: Secondary | ICD-10-CM | POA: Diagnosis not present

## 2024-01-22 DIAGNOSIS — E038 Other specified hypothyroidism: Secondary | ICD-10-CM | POA: Diagnosis not present

## 2024-01-22 DIAGNOSIS — Z139 Encounter for screening, unspecified: Secondary | ICD-10-CM | POA: Diagnosis not present

## 2024-01-22 DIAGNOSIS — Z0000 Encounter for general adult medical examination without abnormal findings: Secondary | ICD-10-CM | POA: Diagnosis not present

## 2024-01-22 DIAGNOSIS — Z1339 Encounter for screening examination for other mental health and behavioral disorders: Secondary | ICD-10-CM | POA: Diagnosis not present

## 2024-01-22 DIAGNOSIS — E785 Hyperlipidemia, unspecified: Secondary | ICD-10-CM | POA: Diagnosis not present

## 2024-01-22 DIAGNOSIS — Z1331 Encounter for screening for depression: Secondary | ICD-10-CM | POA: Diagnosis not present

## 2024-01-22 DIAGNOSIS — Z6824 Body mass index (BMI) 24.0-24.9, adult: Secondary | ICD-10-CM | POA: Diagnosis not present

## 2024-01-22 DIAGNOSIS — Z136 Encounter for screening for cardiovascular disorders: Secondary | ICD-10-CM | POA: Diagnosis not present

## 2024-02-14 DIAGNOSIS — C44519 Basal cell carcinoma of skin of other part of trunk: Secondary | ICD-10-CM | POA: Diagnosis not present

## 2024-02-15 DIAGNOSIS — D485 Neoplasm of uncertain behavior of skin: Secondary | ICD-10-CM | POA: Diagnosis not present

## 2024-03-20 DIAGNOSIS — C4441 Basal cell carcinoma of skin of scalp and neck: Secondary | ICD-10-CM | POA: Diagnosis not present

## 2024-03-20 DIAGNOSIS — C44519 Basal cell carcinoma of skin of other part of trunk: Secondary | ICD-10-CM | POA: Diagnosis not present
